# Patient Record
Sex: Female | Born: 1965 | Race: Black or African American | Hispanic: No | Smoking: Never smoker
Health system: Southern US, Community
[De-identification: ages and names within clinical notes are randomized; demographics above are authoritative.]

## PROBLEM LIST (undated history)

## (undated) DIAGNOSIS — Z9889 Other specified postprocedural states: Secondary | ICD-10-CM

## (undated) DIAGNOSIS — L309 Dermatitis, unspecified: Secondary | ICD-10-CM

## (undated) DIAGNOSIS — G43109 Migraine with aura, not intractable, without status migrainosus: Secondary | ICD-10-CM

## (undated) DIAGNOSIS — G932 Benign intracranial hypertension: Secondary | ICD-10-CM

## (undated) DIAGNOSIS — N62 Hypertrophy of breast: Secondary | ICD-10-CM

## (undated) DIAGNOSIS — K08409 Partial loss of teeth, unspecified cause, unspecified class: Secondary | ICD-10-CM

## (undated) DIAGNOSIS — Z98811 Dental restoration status: Secondary | ICD-10-CM

## (undated) HISTORY — DX: Benign intracranial hypertension: G93.2

## (undated) HISTORY — DX: Dermatitis, unspecified: L30.9

## (undated) HISTORY — PX: REDUCTION MAMMAPLASTY: SUR839

## (undated) HISTORY — DX: Other specified postprocedural states: Z98.890

## (undated) HISTORY — DX: Migraine with aura, not intractable, without status migrainosus: G43.109

---

## 2013-12-14 ENCOUNTER — Encounter: Payer: Self-pay | Admitting: Neurology

## 2013-12-17 ENCOUNTER — Ambulatory Visit (INDEPENDENT_AMBULATORY_CARE_PROVIDER_SITE_OTHER): Payer: 59 | Admitting: Neurology

## 2013-12-17 ENCOUNTER — Ambulatory Visit (INDEPENDENT_AMBULATORY_CARE_PROVIDER_SITE_OTHER): Payer: 59

## 2013-12-17 ENCOUNTER — Encounter: Payer: Self-pay | Admitting: Neurology

## 2013-12-17 ENCOUNTER — Encounter (INDEPENDENT_AMBULATORY_CARE_PROVIDER_SITE_OTHER): Payer: Self-pay

## 2013-12-17 VITALS — BP 116/72 | HR 85 | Ht 66.75 in | Wt 257.0 lb

## 2013-12-17 DIAGNOSIS — G5712 Meralgia paresthetica, left lower limb: Secondary | ICD-10-CM

## 2013-12-17 DIAGNOSIS — G5710 Meralgia paresthetica, unspecified lower limb: Secondary | ICD-10-CM | POA: Insufficient documentation

## 2013-12-17 DIAGNOSIS — G35 Multiple sclerosis: Secondary | ICD-10-CM

## 2013-12-17 MED ORDER — GABAPENTIN 100 MG PO CAPS: 100.0000 mg | ORAL_CAPSULE | Freq: Three times a day (TID) | ORAL | Status: DC

## 2013-12-17 NOTE — Procedures (Signed)
    History:  Sherri Alvarado is a 48 year old patient with a history of headache, abnormal MRI the brain. The patient reports some alteration in peripheral vision. The patient is being evaluated for the visual disturbance.  Description: The visual evoked response test was performed today using 32 x 32 check sizes. The absolute latencies for the N1 and the P100 wave forms were within normal limits bilaterally. The amplitudes for the P100 wave forms were also within normal limits bilaterally. The visual acuity was 20/30 OD and 20/30 OS corrected.  Impression:  The visual evoked response test above was within normal limits bilaterally. No evidence of conduction slowing was seen within the anterior visual pathways on either side on today's evaluation.

## 2013-12-17 NOTE — Patient Instructions (Signed)
Overall you are doing fairly well but I do want to suggest a few things today:   Remember to drink plenty of fluid, eat healthy meals and do not skip any meals. Try to eat protein with a every meal and eat a healthy snack such as fruit or nuts in between meals. Try to keep a regular sleep-wake schedule and try to exercise daily, particularly in the form of walking, 20-30 minutes a day, if you can.   Your leg symptoms are most consistent with a diagnosis of meralgia paresthetica. The best thing you can do for this is exercise frequently, eat a healthy diet, work on losing some weight  As far as your medications are concerned, I would like to suggest the following: 1)Please start gabapentin 100mg  three times a day 2)I would like you to have a test called a visual evoked response to check for MS. Please schedule this when you check out 3)I would like you to have a MRI of the brain with contrast and a MRI of the cervical spine to check for signs of MS  Follow up once your workup is completed. Please call us with any interim questions, concerns, problems, updates or refill requests.   Please also call us for any test results so we can go over those with you on the phone.  My clinical assistant and will answer any of your questions and relay your messages to me and also relay most of my messages to you.   Our phone number is [phone]. We also have an after hours call service for urgent matters and there is a physician on-call for urgent questions. For any emergencies you know to call 911 or go to the nearest emergency room

## 2013-12-17 NOTE — Progress Notes (Signed)
[facility]    Provider:  Dr Sherri Alvarado Referring Provider: Ardyth Man, MD Primary Care Physician:  [facility], Sherri Land, MD  CC:  Question of MS  HPI:  Sherri Alvarado is a 48 y.o. female here as a referral from Dr. Willaim Alvarado for MS evaluation  Patient reports history of sensory changes, pain, headaches, change in peripheral vision. Had MRI brain done which showed "several small scattered areas of increased flare signal w/in the white matter, not consistent with MS, although cannot be excluded". Since March 6 has noticed some numbness and aching in her left leg. Feels symptoms are getting worse. Notes symptoms are just located in the area of the lateral femoral cutaneous nerve on the left. Denies any radiating symptoms distally. No weakness. No symptoms in RLE or bilateral UE. Notes in the past year has gained around 15 pounds.   She reports her mother has MS. Notes increased stress/anxiety in her life. Lost both her mother and sister in the past year, currently undergoing a divorce. Has a stressful job.  Has been out of work recently and feels that has helped her symptoms.   Notes her headaches have increased, typically occuring 3-4 times a week. Notes as a pressure type pain in her occipital region, can last few minutes up to an hour or so. No nausea or emesis. Mild phonophobia. Recently saw an eye doctor for floaters who told her she has MS, unclear based on what findings, there was some question of decreased VF in left inferior quadrant. Notes her left eye is tender to touch.   Review of Systems: Out of a complete 14 system review, the patient complains of only the following symptoms, and all other reviewed systems are negative. + fatigue, eye pain, easy bruising, memory loss, headache, numbness, aching muscles  History   Social History  . Marital Status: Unknown    Spouse Name: N/A    Number of Children: N/A  . Years of Education: N/A   Occupational History  .  Not on file.   Social History Main Topics  . Smoking status: Never Smoker   . Smokeless tobacco: Never Used  . Alcohol Use: Yes     Comment: occ around holidays  . Drug Use: Yes    Special: Marijuana     Comment: occ  . Sexual Activity: Not on file   Other Topics Concern  . Not on file   Social History Narrative   Separated, 2 children   Right handed   3 yrs of college   1 cup 3 times a week    Family History  Problem Relation Age of Onset  . Multiple sclerosis Mother   . Heart murmur Mother   . Hypertension Mother   . Kidney disease Father   . Cancer - Other Father     throat    No past medical history on file.  Past Surgical History  Procedure Laterality Date  . None      Current Outpatient Prescriptions  Medication Sig Dispense Refill  . ALPRAZolam (XANAX) 1 MG tablet       . ibuprofen (ADVIL,MOTRIN) 800 MG tablet       . TRINESSA, 28, 0.18/0.215/0.25 MG-35 MCG tablet        No current facility-administered medications for this visit.    Allergies as of 12/17/2013  . (No Known Allergies)    Vitals: BP 116/72  Pulse 85  Ht 5' 6.75" (1.695 m)  Wt 257 lb (116.574 kg)  BMI 40.58 kg/m2 Last Weight:  Wt Readings from Last 1 Encounters:  12/17/13 257 lb (116.574 kg)   Last Height:   Ht Readings from Last 1 Encounters:  12/17/13 5' 6.75" (1.695 m)     Physical exam: Exam: Gen: NAD, conversant Eyes: anicteric sclerae, moist conjunctivae HENT: Atraumatic, oropharynx clear Neck: Trachea midline; supple,  Lungs: CTA, no wheezing, rales, rhonic                          CV: RRR, no MRG Abdomen: Soft, non-tender;  Extremities: No peripheral edema  Skin: Normal temperature, no rash,  Psych: Appropriate affect, pleasant  Neuro: MS: AA&Ox3, appropriately interactive, normal affect   Speech: fluent w/o paraphasic error  Memory: good recent and remote recall  CN: PERRL, unable to visualize optic discs due to pupil size, [facility] with possible  slight decrease in inferior lateral quadrant of OS, EOMI no nystagmus, no ptosis, sensation intact to LT V1-V3 bilat, face symmetric, no weakness, hearing grossly intact, palate elevates symmetrically, shoulder shrug 5/5 bilat,  tongue protrudes midline, no fasiculations noted.  Motor: normal bulk and tone Strength: 5/5  In all extremities  Coord: rapid alternating and point-to-point (FNF, HTS) movements intact.  Reflexes: symmetrical, bilat downgoing toes  Sens: decreased LT in distribution of lateral femoral cutaneous nerve of left upper extremity  Gait: posture, stance, stride and arm-swing normal. Tandem gait intact. Able to walk on heels and toes. Romberg absent.   Assessment:  After physical and neurologic examination, review of laboratory studies, imaging, neurophysiology testing and pre-existing records, assessment will be reviewed on the problem list.  Plan:  Treatment plan and additional workup will be reviewed under Problem List.  1)Meralgia paresthetica 2)Visual field cuts 3)Depression  47y/o woman presenting for initial evaluation of possible MS with abnormal brain MRI. MRI report not fully consistent with MS but images not available to be reviewed. Sensory changes in LLE is most consistent with a diagnosis of meralgia paresthetica, likely exacerbated by recent weight gain. Counseled patient on diagnosis, she expressed understanding. With visual changes, and family history of MS will complete MS workup, including MRI brain w/ contrast, MRI C spine and VER. If tests concerning for MS would consider LP. Start gabapentin 100mg  TID for symptomatic relief.    Sherri ChoPeter Abundio Teuscher, DO  [facility] [address] [place], [place] [id number]  Phone [phone] Fax [phone]

## 2013-12-26 ENCOUNTER — Ambulatory Visit (INDEPENDENT_AMBULATORY_CARE_PROVIDER_SITE_OTHER): Payer: 59

## 2013-12-26 DIAGNOSIS — G35 Multiple sclerosis: Secondary | ICD-10-CM

## 2013-12-26 MED ORDER — GADOPENTETATE DIMEGLUMINE 469.01 MG/ML IV SOLN: 20.0000 mL | Freq: Once | INTRAVENOUS | Status: AC | PRN

## 2014-01-01 ENCOUNTER — Telehealth: Payer: Self-pay | Admitting: Neurology

## 2014-01-01 NOTE — Telephone Encounter (Signed)
Pt calling requesting MRI results, Dr. Hosie Poisson out of the office, sending to [facility], Dr. Vickey Huger. Please advise

## 2014-01-01 NOTE — Telephone Encounter (Signed)
Calling for MRI results.

## 2014-01-01 NOTE — Telephone Encounter (Addendum)
I called pt after consulting Dr. Marjory Lies.  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  Pt verbalized understanding.  She states that gabapentin has helped her leg, but she continues with headache.  ? LP.   Released results to MyChart.  Wanted to fax results to [place] in [place].  I faxed to her pcp/referring MD.

## 2014-01-03 ENCOUNTER — Other Ambulatory Visit: Payer: Self-pay | Admitting: Neurology

## 2014-01-03 ENCOUNTER — Telehealth: Payer: Self-pay | Admitting: *Deleted

## 2014-01-03 DIAGNOSIS — M542 Cervicalgia: Secondary | ICD-10-CM

## 2014-01-03 NOTE — Telephone Encounter (Signed)
Pt wanted to know if she could continue taking gabapentin (NEURONTIN) 100 MG capsule.  Please call and advise.  Thanks

## 2014-01-03 NOTE — Progress Notes (Signed)
Quick Note:  Informed patient of normal MRI as requested, patient states she received a call stating that she received a call stating something else, checked phone notes and Alverda Skeans spoke with her, patient states that she still has some questions about what's next for treatment, and medication, she would like to talk to you at your earliest convenience. ______

## 2014-01-03 NOTE — Telephone Encounter (Signed)
Please let her know it is fine to continue taking the gabapentin. She will also follow up with physical therapy as instructed. Thanks.

## 2014-01-04 NOTE — Telephone Encounter (Signed)
I called pt and let her know to continue with gabapentin.  She was on the phone with PT when I called.  I made f/u appt 01-22-14 to go over test results.

## 2014-01-11 ENCOUNTER — Telehealth: Payer: Self-pay | Admitting: *Deleted

## 2014-01-14 NOTE — Telephone Encounter (Signed)
I called pt and let her know that [employer]  Is doing the forms.  She faxed last Wednesday.   Would like copy when she comes for appt on 01-22-14.  I would forward to Stanton KidneyDebra in MR.

## 2014-01-22 ENCOUNTER — Encounter: Payer: Self-pay | Admitting: Neurology

## 2014-01-22 ENCOUNTER — Encounter (INDEPENDENT_AMBULATORY_CARE_PROVIDER_SITE_OTHER): Payer: Self-pay

## 2014-01-22 ENCOUNTER — Ambulatory Visit (INDEPENDENT_AMBULATORY_CARE_PROVIDER_SITE_OTHER): Payer: 59 | Admitting: Neurology

## 2014-01-22 VITALS — BP 121/73 | HR 78 | Ht 66.0 in | Wt 251.0 lb

## 2014-01-22 DIAGNOSIS — R51 Headache: Secondary | ICD-10-CM

## 2014-01-22 DIAGNOSIS — R519 Headache, unspecified: Secondary | ICD-10-CM | POA: Insufficient documentation

## 2014-01-22 DIAGNOSIS — G5710 Meralgia paresthetica, unspecified lower limb: Secondary | ICD-10-CM

## 2014-01-22 DIAGNOSIS — H5340 Unspecified visual field defects: Secondary | ICD-10-CM

## 2014-01-22 DIAGNOSIS — Z0289 Encounter for other administrative examinations: Secondary | ICD-10-CM

## 2014-01-22 NOTE — Patient Instructions (Signed)
Overall you are doing fairly well but I do want to suggest a few things today:   Please follow up with your eye doctor and have your results faxed to [place]  As far as diagnostic testing:  1)I would like you to have a lumbar puncture done, you will be called to schedule this  Follow up once the lumbar puncture is completed. Please call us with any interim questions, concerns, problems, updates or refill requests.   My clinical assistant and will answer any of your questions and relay your messages to me and also relay most of my messages to you.   Our phone number is [phone]. We also have an after hours call service for urgent matters and there is a physician on-call for urgent questions. For any emergencies you know to call 911 or go to the nearest emergency room

## 2014-01-22 NOTE — Progress Notes (Signed)
[facility]    Provider:  Dr Hosie Poisson Referring Provider: Ardyth Man, MD Primary Care Physician:  Ardyth Man, MD  CC:  Question of MS  HPI:  Sherri Alvarado is a 48 y.o. female here as a follow up from Dr. Willaim Bane for MS evaluation. At last visit her symptoms were consistent with a diagnosis of meralgia paresthetica and she was started on Gabapentin 100mg  TID which has helped improve her LE symptoms. She continues to suffer from headache and visual deficits. Scheduled to see her eye doctor next week. She notes episodes consistent with transient obscurations. No other acute concerns at this time.    Initial visit 12/2013: Patient reports history of sensory changes, pain, headaches, change in peripheral vision. Had MRI brain done which showed "several small scattered areas of increased flare signal w/in the white matter, not consistent with MS, although cannot be excluded". Since March 6 has noticed some numbness and aching in her left leg. Feels symptoms are getting worse. Notes symptoms are just located in the area of the lateral femoral cutaneous nerve on the left. Denies any radiating symptoms distally. No weakness. No symptoms in RLE or bilateral UE. Notes in the past year has gained around 15 pounds.   She reports her mother has MS. Notes increased stress/anxiety in her life. Lost both her mother and sister in the past year, currently undergoing a divorce. Has a stressful job.  Has been out of work recently and feels that has helped her symptoms.   Notes her headaches have increased, typically occuring 3-4 times a week. Notes as a pressure type pain in her occipital region, can last few minutes up to an hour or so. No nausea or emesis. Mild phonophobia. Recently saw an eye doctor for floaters who told her she has MS, unclear based on what findings, there was some question of decreased VF in left inferior quadrant. Notes her left eye is tender to touch.   Review of  Systems: Out of a complete 14 system review, the patient complains of only the following symptoms, and all other reviewed systems are negative. + fatigue, eye pain, easy bruising, memory loss, headache, numbness, aching muscles  History   Social History  . Marital Status: Unknown    Spouse Name: N/A    Number of Children: N/A  . Years of Education: N/A   Occupational History  . Not on file.   Social History Main Topics  . Smoking status: Never Smoker   . Smokeless tobacco: Never Used  . Alcohol Use: Yes     Comment: occ around holidays  . Drug Use: Yes    Special: Marijuana     Comment: occ  . Sexual Activity: Not on file   Other Topics Concern  . Not on file   Social History Narrative   Separated, 2 children   Right handed   3 yrs of college   1 cup 3 times a week    Family History  Problem Relation Age of Onset  . Multiple sclerosis Mother   . Heart murmur Mother   . Hypertension Mother   . Kidney disease Father   . Cancer - Other Father     throat    No past medical history on file.  Past Surgical History  Procedure Laterality Date  . None      Current Outpatient Prescriptions  Medication Sig Dispense Refill  . ALPRAZolam (XANAX) 1 MG tablet       .  gabapentin (NEURONTIN) 100 MG capsule Take 1 capsule (100 mg total) by mouth 3 (three) times daily.  90 capsule  3  . TRINESSA, 28, 0.18/0.215/0.25 MG-35 MCG tablet       . ibuprofen (ADVIL,MOTRIN) 800 MG tablet        No current facility-administered medications for this visit.    Allergies as of 01/22/2014  . (No Known Allergies)    Vitals: BP 121/73  Pulse 78  Ht 5\' 6"  (1.676 m)  Wt 251 lb (113.853 kg)  BMI 40.53 kg/m2 Last Weight:  Wt Readings from Last 1 Encounters:  01/22/14 251 lb (113.853 kg)   Last Height:   Ht Readings from Last 1 Encounters:  01/22/14 5\' 6"  (1.676 m)     Physical exam: Exam: Gen: NAD, conversant Eyes: anicteric sclerae, moist conjunctivae HENT: Atraumatic,  oropharynx clear Neck: Trachea midline; supple,  Lungs: CTA, no wheezing, rales, rhonic                          CV: RRR, no MRG Abdomen: Soft, non-tender;  Extremities: No peripheral edema  Skin: Normal temperature, no rash,  Psych: Appropriate affect, pleasant  Neuro: MS: AA&Ox3, appropriately interactive, normal affect   Speech: fluent w/o paraphasic error  Memory: good recent and remote recall  CN: PERRL, unable to visualize optic discs due to pupil size, [facility] to [facility] with possible slight decrease in inferior lateral quadrant of OS, EOMI no nystagmus, no ptosis, sensation intact to LT V1-V3 bilat, face symmetric, no weakness, hearing grossly intact, palate elevates symmetrically, shoulder shrug 5/5 bilat,  tongue protrudes midline, no fasiculations noted.  Motor: normal bulk and tone Strength: 5/5  In all extremities  Coord: rapid alternating and point-to-point (FNF, HTS) movements intact.  Reflexes: symmetrical, bilat downgoing toes  Sens: decreased LT in distribution of lateral femoral cutaneous nerve of left upper extremity  Gait: posture, stance, stride and arm-swing normal. Tandem gait intact. Able to walk on heels and toes. Romberg absent.   Assessment:  After physical and neurologic examination, review of laboratory studies, imaging, neurophysiology testing and pre-existing records, assessment will be reviewed on the problem list.  Plan:  Treatment plan and additional workup will be reviewed under Problem List.  1)Meralgia paresthetica 2)headache 3)visual field cuts  47y/o woman presenting for follow up evaluation of symptoms consistent with a diagnosis of meralgia paresthetica which has responded well to gabapentin. Etiology of headache and visual field deficits is unclear. Have concern over possible IIH based on history of headache and visual deficits. Patient to follow up with eye doctor and will plan for lumbar puncture. Can consider starting topamax or diamox  pending results.    Sherri ChoPeter Lassie Demorest, DO  [facility] [address] [place], [place] [address]  Phone [phone] Fax [phone]

## 2014-01-23 ENCOUNTER — Ambulatory Visit (HOSPITAL_COMMUNITY)
Admission: RE | Admit: 2014-01-23 | Discharge: 2014-01-23 | Disposition: A | Discharge status: Home / Self Care | Payer: 59 | Source: Ambulatory Visit | Attending: Neurology | Admitting: Neurology

## 2014-01-23 DIAGNOSIS — M542 Cervicalgia: Secondary | ICD-10-CM | POA: Insufficient documentation

## 2014-01-23 DIAGNOSIS — IMO0001 Reserved for inherently not codable concepts without codable children: Secondary | ICD-10-CM | POA: Insufficient documentation

## 2014-01-23 DIAGNOSIS — R29898 Other symptoms and signs involving the musculoskeletal system: Secondary | ICD-10-CM | POA: Insufficient documentation

## 2014-01-23 DIAGNOSIS — R209 Unspecified disturbances of skin sensation: Secondary | ICD-10-CM | POA: Insufficient documentation

## 2014-01-23 DIAGNOSIS — R51 Headache: Secondary | ICD-10-CM | POA: Insufficient documentation

## 2014-01-23 NOTE — Evaluation (Signed)
Physical Therapy Evaluation  Patient Details  Name: Sherri Alvarado Hiser-Goins MRN: [id number] Date of Birth: 01-26-1966  Today'Alvarado Date: 01/23/2014 Time: 1015-1100 PT Time Calculation (min): 45 min    Charges: 1 Evaluation,  Manual 1043-1100           Visit#: 1 of 16  Re-eval: 02/22/14 Assessment Diagnosis: Limited cervical spine mobility secondary to pain, weakness  Next MD Visit: to  be determined Prior Therapy: no   Authorization: [employer]    Authorization Time Period:    Authorization Visit#:   of     Past Medical History: No past medical history on file. Past Surgical History:  Past Surgical History  Procedure Laterality Date  . None      Subjective Symptoms/Limitations Symptoms: head aches, numbness in Lt leg anterior hip to knee (intermittent),  Lt eye pain, weakness in Lt arm Pertinent History: Cervical pain due to possible disc pathology. Patient has had other symptoms including neck shoulder, low back, pain in Lt eye and head aches that may be due to another factor resulting in doctor to want to due a spinal tap to determine if the head aches,  eye pain, and neck pain are related. Patient has halready been screened for MS by doctor via MRi which was ruled out.  Patient works a Health and safety inspectordesk job  during [employer] she notes neck paina nd headaches. MRI: C2-3 disc bulge Pain Assessment Currently in Pain?: Yes Pain Score: 7  Pain Location: Neck Pain Orientation: Mid;Medial Pain Type: Chronic pain Pain Radiating Towards: Lt arm/leg/headache Pain Onset: More than a month ago Pain Frequency: Intermittent Pain Relieving Factors: correcting posture, sleeping on back  Effect of Pain on Daily Activities: difficulty turning neck to Lt and Rt, difficulty picking up things from ground, middle low back pain. pain with looking up vs down, pain/numbeness with Let side laying,   Precautions/Restrictions     Balance Screening Balance Screen Has the patient fallen in the past 6 months: Yes How many  times?: 1 Has the patient had a decrease in activity level because of a fear of falling? : No Is the patient reluctant to leave their home because of a fear of falling? : No  Sensation/Coordination/Flexibility/Functional Tests Functional Tests Functional Tests: Cervical spine joint mobility C1-C6 severe limited down glide ROM and reproduction of symptoms bilaterally, C1-C6 up glide mild mobility limitation, releif of pain Functional Tests: FOTO: 51% limitation, 49% function  Assessment Cervical AROM Cervical Flexion: 55 (pain with holding) Cervical Extension: 33 (end range pain) Cervical - Right Side Bend: 40 Cervical - Left Side Bend: 53 Cervical - Right Rotation: 53 Cervical - Left Rotation: 60 Cervical Strength Cervical Flexion: 3/5 Cervical Extension: 3/5 Palpation Palpation: Scalenes particularly tender.   Exercise/Treatments Stretches Other Neck Stretches: 3D neck stretch with shoulder distraction  Manual Therapy Manual Therapy: Myofascial release Joint Mobilization: Cervical distraction, bilateral C1-CD6 up and down glides, liitations noticed particularly at C5,6 Myofascial Release: Upper Trapezius, Levator, Scalenes, Subpccipital release  Physical Therapy Assessment and Plan PT Assessment and Plan Clinical Impression Statement: Patient demsontrates limited cervical spine mobility secondary to pain at end range of motion, increased muscle tension (Lavator, Upper Trapezius, paraspinals and Scalenes) and weak cervical muswcle stabilizers.   Pt will benefit from skilled therapeutic intervention in order to improve on the following deficits: Pain;Decreased activity tolerance;Decreased balance;Decreased range of motion;Increased muscle spasms;Increased fascial restricitons;Difficulty walking;Decreased strength;Impaired flexibility;Obesity Rehab Potential: Fair Clinical Impairments Affecting Rehab Potential: history of neck pain with thigh numbness, eye pain, and headaches  indicates other possible neurological factors PT Frequency: Min 2X/week PT Duration: 8 weeks PT Treatment/Interventions: Gait training;Functional mobility training;Therapeutic exercise;Manual techniques;Patient/family education PT Plan: Increase cervical spine mobility via stretches and manual techniques to improve joint and muscle mobility. Next session specifically address suboccipital and scalene muscle mmobilit limitations to improve symptoms.     Goals Home Exercise Program Pt/caregiver will Perform Home Exercise Program: For increased ROM PT Goal: Perform Home Exercise Program - Progress: Goal set today PT Short Term Goals Time to Complete Short Term Goals: 4 weeks PT Short Term Goal 1: patient will be able to extend cervical spine to 30 degrees with no pain.  PT Short Term Goal 2: patient will have head aches of <2 times daily PT Short Term Goal 3: patient deep neck flexor strength will demostrate improving endurance with ability to hold head off table in supine  in chin tuck position >20 seconds demosntratign improved ability to maintain good posture during work. PT Short Term Goal 4: Patient will be able to rotate head 60 degrees bilateral with no pain at end range PT Long Term Goals Time to Complete Long Term Goals: 8 weeks PT Long Term Goal 1: Patient will be pain free with all cervical spine AROM PT Long Term Goal 2: patient will expeience head aches of less than 3 times weekly Long Term Goal 3: patient deep neck flexor strength will demostrate improving endurance with ability to hold head off table in supine  in chin tuck position >60 seconds demosntratign improved ability to maintain good posture during work.  Problem List Patient Active Problem List   Diagnosis Date Noted  . Headache(784.0) 01/22/2014  . Meralgia paresthetica 12/17/2013    PT - End of Session Activity Tolerance: Patient tolerated treatment well General Behavior During Therapy: WFL for tasks  assessed/performed PT Plan of Care PT Home Exercise Plan: 3D cervical spine stretch 10x 3 seconds, 2x daily Consulted and Agree with Plan of Care: Patient  GP   Doyne Keel 01/23/2014, 12:20 PM  Physician Documentation Your signature is required to indicate approval of the treatment plan as stated above.  Please sign and either send electronically or make a copy of this report for your files and return this physician signed original.   Please mark one 1.__approve of plan  2. ___approve of plan with the following conditions.   ______________________________                                                          _____________________ Physician Signature                                                                                                             Date

## 2014-01-29 ENCOUNTER — Ambulatory Visit (HOSPITAL_COMMUNITY)
Admission: RE | Admit: 2014-01-29 | Discharge: 2014-01-29 | Disposition: A | Discharge status: Home / Self Care | Payer: 59 | Source: Ambulatory Visit | Attending: Family Medicine | Admitting: Family Medicine

## 2014-01-29 NOTE — Progress Notes (Signed)
Physical Therapy Treatment Patient Details  Name: Sherri Alvarado MRN: [id number] Date of Birth: 28-Apr-1966  Today's Date: 01/29/2014 Time: 0930-1015 PT Time Calculation (min): 45 min    Charges: Manual therapy [phone], TherEx 1000-1015 Visit#: 2 of 16  Re-eval: 02/22/14 Assessment Diagnosis: Limited cervical spine mobility secondary to pain, weakness  Next MD Visit: to  be determined Prior Therapy: no   Authorization: [employer]   Subjective: Symptoms/Limitations Symptoms: Patient states exercises have been going well and that she feels the difference, but continues to have pain. continues Lt arm numbness Pertinent History: Cervical pain due to possible disc pathology. Patient has had other symptoms including neck shoulder, low back, pain in Lt eye and head aches that may be due to another factor resulting in doctor to want to due a spinal tap to determine if the head aches,  eye pain, and neck pain are related. Patient has halready been screened for MS by doctor via [place] which was ruled out.  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 MRI: C2-3 disc bulge Pain Assessment Currently in Pain?: Yes Pain Score: 7  Pain Location: Neck Pain Orientation: Mid;Medial Pain Type: Chronic pain Pain Onset: More than a month ago Pain Frequency: Intermittent Pain Relieving Factors: correcting posture, sleeping on back Effect of Pain on Daily Activities: difficulty turning neck to Lt and Rt, difficulty picking up things from ground, middle low back pain. pain with looking up vs down, pain/numbeness with Let side laying  Precautions/Restrictions     Exercise/Treatments Stretches Levator Stretch: 5 reps;10 seconds Other Neck Stretches: 3D neck stretch with shoulder distraction 10x 3 seconds in sitting and standing Other Neck Stretches: 3 way Pec stretch 10x 3 Sec    Posterior shoulder stretch 10x 3seconds Manual Therapy Joint Mobilization: Cervical  distraction, Lt C1-CD6 upglides, limitations noticed particularly at C1,2,5,6 Myofascial Release: Upper Trapezius, Levator, Scalenes, Subpccipital release, Pec Major/minor release  Physical Therapy Assessment and Plan PT Assessment and Plan Clinical Impression Statement: Patient demonstrates limited cervical spine mobility secondary to pain at end range of motion, increased muscle tension (Lavator, Upper Trapezius, paraspinals and Scalenes) and weak cervical muscle stabilizers. Specifically patient has limited joint mobility with cervical up-glides throughout Lt Cervical spine, though performance of said glides decreased patient's pain, numbness and tingling. Patient also displayed limited pectoral mobility that when compressed resulted in patient's Lt arm pain, following Pectoral release patient noted decreased UE symptoms.  PT Plan: Increase cervical spine mobility via stretches and manual techniques to improve joint and muscle mobility. Next session specifically address suboccipital and scalene muscle mmobilit limitations to improve symptoms.     Problem List Patient Active Problem List   Diagnosis Date Noted  . Headache(784.0) 01/22/2014  . Meralgia paresthetica 12/17/2013    PT - End of Session Activity Tolerance: Patient tolerated treatment well General Behavior During Therapy: WFL for tasks assessed/performed PT Plan of Care PT Home Exercise Plan: 3D cervical spine stretch 10x 3 seconds, 2x daily Peck and posteriro shoudler stretch  GP    Gwendelyn Lanting R Lerline Valdivia 01/29/2014, 8:02 PM

## 2014-01-31 ENCOUNTER — Ambulatory Visit (HOSPITAL_COMMUNITY): Payer: 59 | Admitting: Physical Therapy

## 2014-02-06 ENCOUNTER — Inpatient Hospital Stay (HOSPITAL_COMMUNITY): Admission: RE | Admit: 2014-02-06 | Payer: 59 | Source: Ambulatory Visit

## 2014-02-07 ENCOUNTER — Inpatient Hospital Stay: Admission: RE | Admit: 2014-02-07 | Payer: 59 | Source: Ambulatory Visit

## 2014-02-08 ENCOUNTER — Ambulatory Visit (HOSPITAL_COMMUNITY): Payer: 59 | Admitting: Physical Therapy

## 2014-02-12 ENCOUNTER — Ambulatory Visit (HOSPITAL_COMMUNITY): Payer: 59 | Admitting: Physical Therapy

## 2014-02-14 ENCOUNTER — Ambulatory Visit (HOSPITAL_COMMUNITY): Payer: 59 | Admitting: Physical Therapy

## 2014-02-15 ENCOUNTER — Ambulatory Visit
Admission: RE | Admit: 2014-02-15 | Discharge: 2014-02-15 | Disposition: A | Discharge status: Home / Self Care | Payer: 59 | Source: Ambulatory Visit | Attending: Neurology | Admitting: Neurology

## 2014-02-15 DIAGNOSIS — H5340 Unspecified visual field defects: Secondary | ICD-10-CM

## 2014-02-15 DIAGNOSIS — G5710 Meralgia paresthetica, unspecified lower limb: Secondary | ICD-10-CM

## 2014-02-15 DIAGNOSIS — R51 Headache: Secondary | ICD-10-CM

## 2014-02-15 LAB — CSF CELL COUNT WITH DIFFERENTIAL
RBC Count, CSF: 0 cu mm
Tube #: 2
WBC CSF: 5 uL (ref 0–5)

## 2014-02-15 LAB — PROTEIN, CSF: TOTAL PROTEIN, CSF: 37 mg/dL (ref 15–45)

## 2014-02-15 MED ORDER — DIAZEPAM 5 MG PO TABS
10.0000 mg | ORAL_TABLET | Freq: Once | ORAL | Status: AC
  Administered 2014-02-15: 10 mg via ORAL

## 2014-02-15 NOTE — Discharge Instructions (Signed)

## 2014-02-18 LAB — CSF CULTURE
GRAM STAIN: NONE SEEN
ORGANISM ID, BACTERIA: NO GROWTH

## 2014-02-18 LAB — CSF CULTURE W GRAM STAIN: Gram Stain: NONE SEEN

## 2014-02-19 ENCOUNTER — Ambulatory Visit (HOSPITAL_COMMUNITY)
Admission: RE | Admit: 2014-02-19 | Discharge: 2014-02-19 | Disposition: A | Discharge status: Home / Self Care | Payer: 59 | Source: Ambulatory Visit | Attending: Neurology | Admitting: Neurology

## 2014-02-19 ENCOUNTER — Telehealth: Payer: Self-pay | Admitting: Neurology

## 2014-02-19 DIAGNOSIS — R29898 Other symptoms and signs involving the musculoskeletal system: Secondary | ICD-10-CM | POA: Insufficient documentation

## 2014-02-19 DIAGNOSIS — IMO0001 Reserved for inherently not codable concepts without codable children: Secondary | ICD-10-CM | POA: Insufficient documentation

## 2014-02-19 DIAGNOSIS — M542 Cervicalgia: Secondary | ICD-10-CM | POA: Insufficient documentation

## 2014-02-19 DIAGNOSIS — R209 Unspecified disturbances of skin sensation: Secondary | ICD-10-CM | POA: Insufficient documentation

## 2014-02-19 DIAGNOSIS — R51 Headache: Secondary | ICD-10-CM | POA: Insufficient documentation

## 2014-02-19 NOTE — Telephone Encounter (Signed)
Informed patient that results not reviewed by physician as yet, will call back once reviewed

## 2014-02-19 NOTE — Progress Notes (Signed)
Physical Therapy Treatment Patient Details  Name: Sherri Alvarado MRN: [id number] Date of Birth: December 13, 1965  Today's Date: 02/19/2014 Time: 1030-1100 PT Time Calculation (min): 30 min    Charges: TherEx 1030-1045, Manual 1045-1100 Visit#: 3 of 16  Re-eval: 02/22/14   Authorization: [employer]   Subjective: Symptoms/Limitations Symptoms: Patient states that she contineus to have pain with headaches and occassional Lt arm numbness following jogging. Patient said she had been feeling really well which made her want to try joging again.   Exercise/Treatments Stretches Upper Trapezius Stretch: 5 reps;10 seconds Levator Stretch: 5 reps;10 seconds Other Neck Stretches: 3D neck stretch with shoulder distraction 10x 3 seconds in sitting and standing Other Neck Stretches: 3 way Pec stretch 10x 3 Sec Standing Exercises Neck Retraction: 10 reps Seated Exercises W Back: 10 reps  Physical Therapy Assessment and Plan PT Assessment and Plan Clinical Impression Statement: Patient continues to demonstrate limited cervical spine mobility secondary to pain at end range of motion, increased muscle tension (Lavator, Upper Trapezius, paraspinals and Scalenes) and weak cervical muscle stabilizers. Specifically patient has limited joint mobility with cervical up-glides throughout Lt Cervical spine, though performance of said glides decreased patient's pain, numbness and tingling. Patient also displayed limited pectoral mobility that when compressed resulted in patient's Lt arm pain, following pectoral release patient noted decreased UE symptoms. Patient also noted improved headache following upper trapezius release. Patient's symptoms improved following strengthening exercises.  PT Plan: Increase cervical spine mobility via stretches and manual techniques to improve joint and muscle mobility. Next session specifically address suboccipital and scalene muscle mmobility limitations to improve symptoms and  progress deep neck flexor strengtheing as tolerated    Goals PT Short Term Goals PT Short Term Goal 1: patient will be able to extend cervical spine to 30 degrees with no pain.  PT Short Term Goal 2: patient will have head aches of <2 times daily PT Short Term Goal 2 - Progress: Progressing toward goal PT Short Term Goal 3: patient deep neck flexor strength will demostrate improving endurance with ability to hold head off table in supine  in chin tuck position >20 seconds demosntratign improved ability to maintain good posture during work. PT Short Term Goal 3 - Progress: Progressing toward goal PT Short Term Goal 4: Patient will be able to rotate head 60 degrees bilateral with no pain at end range PT Short Term Goal 4 - Progress: Progressing toward goal PT Long Term Goals PT Long Term Goal 1: Patient will be pain free with all cervical spine AROM PT Long Term Goal 1 - Progress: Progressing toward goal PT Long Term Goal 2: patient will expeience head aches of less than 3 times weekly PT Long Term Goal 2 - Progress: Progressing toward goal Long Term Goal 3: patient deep neck flexor strength will demostrate improving endurance with ability to hold head off table in supine  in chin tuck position >60 seconds demosntratign improved ability to maintain good posture during work. Long Term Goal 3 Progress: Progressing toward goal  Problem List Patient Active Problem List   Diagnosis Date Noted  . Headache(784.0) 01/22/2014  . Meralgia paresthetica 12/17/2013    PT - End of Session Activity Tolerance: Patient tolerated treatment well General Behavior During Therapy: WFL for tasks assessed/performed PT Plan of Care PT Home Exercise Plan: 3D cervical spine stretch 10x 3 seconds and 2x daily [employer]   GP    Tomicka Lover R Osmel Dykstra 02/19/2014, 11:01 AM

## 2014-02-19 NOTE — Telephone Encounter (Signed)
Patient calling for Spinal tap results.  Please return call [phone].

## 2014-02-19 NOTE — Telephone Encounter (Signed)
Returned call, no answer. Message left. Will try again at later date

## 2014-02-20 ENCOUNTER — Other Ambulatory Visit: Payer: Self-pay | Admitting: Neurology

## 2014-02-20 MED ORDER — TOPIRAMATE 25 MG PO TABS: 25.0000 mg | ORAL_TABLET | Freq: Two times a day (BID) | ORAL | Status: DC

## 2014-02-20 NOTE — Telephone Encounter (Signed)
Patient returning phone call regarding results

## 2014-02-20 NOTE — Telephone Encounter (Signed)
Returned call. Will start patient on Topamax 25mg  BID.

## 2014-02-22 ENCOUNTER — Inpatient Hospital Stay (HOSPITAL_COMMUNITY): Admission: RE | Admit: 2014-02-22 | Payer: 59 | Source: Ambulatory Visit | Admitting: Physical Therapy

## 2014-02-22 ENCOUNTER — Ambulatory Visit (HOSPITAL_COMMUNITY): Payer: 59 | Admitting: Physical Therapy

## 2014-02-26 ENCOUNTER — Ambulatory Visit (HOSPITAL_COMMUNITY): Payer: 59 | Admitting: Physical Therapy

## 2014-02-26 ENCOUNTER — Ambulatory Visit (HOSPITAL_COMMUNITY)
Admission: RE | Admit: 2014-02-26 | Discharge: 2014-02-26 | Disposition: A | Discharge status: Home / Self Care | Payer: 59 | Source: Ambulatory Visit | Attending: Family Medicine | Admitting: Family Medicine

## 2014-02-26 NOTE — Evaluation (Addendum)
Physical Therapy Evaluation  Patient Details  Name: Sherri Alvarado MRN: [id number] Date of Birth: [birth year]  Today's Date: 02/26/2014 Time: 1020-1100 PT Time Calculation (min): 40 min    Charges: manual therapy 1020-1050, TherEx 1050-1100          Visit#: 5 of 16  Re-eval: 03/28/14   Authorization: U[employer]    Authorization Visit#:  5 of   16  Past Medical History: No past medical history on file. Past Surgical History:  Past Surgical History  Procedure Laterality Date  . None     Subjective Symptoms/Limitations Symptoms: Patient recently has had no head aches for the last 4 days, contineuds to have eye blurriness and neck pain. 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 Cervical pain due to possible disc pathology. Patient has had other symptoms including neck shoulder, low back, pain in Lt eye and head aches that may be due to another factor resulting in doctor to want to due a spinal tap to determine if the head aches,  eye pain, and neck pain are related. Patient has halready been screened for MS by doctor via [place] which was ruled out.  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 MRI: C2-3 disc bulge Pain Assessment Currently in Pain?: Yes Pain Score: 5   Assessment Cervical AROM Cervical Flexion: 55 (pain with holding) Cervical Extension: 43 Cervical - Right Side Bend: 40 Cervical - Left Side Bend: 43 Cervical - Right Rotation: 64 Cervical - Left Rotation: 65 Cervical Strength Cervical Flexion: 3/5 Cervical Extension: 3/5 Palpation Palpation: tenderness in Uppertrapezius  Exercise/Treatments Stretches Upper Trapezius Stretch: 5 reps;10 seconds Levator Stretch: 5 reps;10 seconds Other Neck Stretches: 3D neck stretch with shoulder  distraction 10x 3 seconds in sitting and standing Other Neck Stretches: 3 way Pec stretch 10x 3 Sec  Manual Therapy Joint Mobilization: Cervical distraction, Lt C1-C6 upglides, limitations noticed particularly at C1,2,5,6 Myofascial Release: Upper Trapezius, Levator, Scalenes, Subpccipital release, Pec Major/minor release 70mnutes  Physical Therapy Assessment and Plan PT Assessment and Plan Clinical Impression Statement: Patient demonstrates limited though significantly improved cervical spine AROM with improved muscle tension in all cervical spine muscles though patient continues to have limited upper trapezius mobility.  Specifically patient has limited joint mobility with cervical up-glides throughout Lt Cervical spine, though performance of said glides decreased patient's pain, numbness and tingling. Patient also displayed limited pectoral mobility that when compressed resulted in patient's Lt arm pain, following pectoral release patient noted decreased UE symptoms. Patient will continue to benefit from skilled physical therapy to address limited ROM an UE and postural muscle weakness. Patient's slow progress thus far is attributed to limited exercise adherence and difficulty attending all physical therapy session for which patient states this session, without cuing, that she well be  more astute in her performance of HEP and attending physical therapy sessions.  PT Plan: Continue phsyciaql therapy 2x a week for 4 more weeks with focus on progressing ROM and stability/strength of UE and cervical spine muscles. introduce cervical spine matrix next session    PT Short Term Goals PT Short Term Goal 1: patient will be able to extend cervical spine to 30 degrees with no pain.  PT Short Term Goal 1 - Progress: Met PT Short Term Goal 2: patient will have head aches of <  2 times daily PT Short Term Goal 2 - Progress: Met PT Short Term Goal 3: patient deep neck flexor strength will demostrate improving  endurance with ability to hold head off table in supine  in chin tuck position >20 seconds demosntratign improved ability to maintain good posture during work. PT Short Term Goal 3 - Progress: Progressing toward goal PT Short Term Goal 4: Patient will be able to rotate head 60 degrees bilateral with no pain at end range PT Short Term Goal 4 - Progress: Met PT Long Term Goals PT Long Term Goal 1: Patient will be pain free with all cervical spine AROM PT Long Term Goal 1 - Progress: Progressing toward goal PT Long Term Goal 2: patient will expeience head aches of less than 3 times weekly PT Long Term Goal 2 - Progress: Progressing toward goal Long Term Goal 3: patient deep neck flexor strength will demostrate improving endurance with ability to hold head off table in supine  in chin tuck position >60 seconds demosntratign improved ability to maintain good posture during work. Long Term Goal 3 Progress: Progressing toward goal Problem List Patient Active Problem List   Diagnosis Date Noted  . Headache(784.0) 01/22/2014  . Meralgia paresthetica 12/17/2013       GP    DeWitt, Cash R PT DPT 02/26/2014, 11:26 AM  Physician Documentation Your signature is required to indicate approval of the treatment plan as stated above.  Please sign and either send electronically or make a copy of this report for your files and return this physician signed original.   Please mark one 1.__approve of plan  2. ___approve of plan with the following conditions.   ______________________________                                                          _____________________ Physician Signature                                                                                                             Date

## 2014-02-27 ENCOUNTER — Telehealth: Payer: Self-pay | Admitting: Neurology

## 2014-02-27 NOTE — Telephone Encounter (Signed)
Pt called to request a refill prescription for ALPRAZolam Prudy Feeler) 1 MG tablet for her severe headache and pt also wants to know is she to continue taking her gabapentin (NEURONTIN) 100 MG capsule? Pt states the topiramate (TOPAMAX) 25 MG tablet is helping some but wanted to make sure if she is continue on the Gabapentin? Please call pt when this has been called in and also advised of the continuation of the Gabapentin. Thanks

## 2014-02-27 NOTE — Telephone Encounter (Signed)
She can continue on topamax and gabapentin for now. I did not prescribe the xanax and it is not a typical headache medication. If she wants a refill she will need to follow up with the prescribing physician. Thanks.

## 2014-02-27 NOTE — Telephone Encounter (Signed)
I called the patient back.  Got no answer.  Left message. 

## 2014-02-27 NOTE — Telephone Encounter (Signed)
I called back.  The patient is requesting we prescribe Xanax for "severe" headaches.  As well, she notes Topamax is giving some benefit.  She would like to know if she should continue taking Gabapentin as well, or only Topamax.  Please advise.  Thank you.

## 2014-02-28 ENCOUNTER — Ambulatory Visit (HOSPITAL_COMMUNITY): Payer: 59

## 2014-02-28 ENCOUNTER — Telehealth: Payer: Self-pay | Admitting: Neurology

## 2014-02-28 ENCOUNTER — Ambulatory Visit (HOSPITAL_COMMUNITY)
Admission: RE | Admit: 2014-02-28 | Discharge: 2014-02-28 | Disposition: A | Discharge status: Home / Self Care | Payer: 59 | Source: Ambulatory Visit | Attending: Family Medicine | Admitting: Family Medicine

## 2014-02-28 ENCOUNTER — Ambulatory Visit (HOSPITAL_COMMUNITY): Payer: 59 | Admitting: Physical Therapy

## 2014-02-28 NOTE — Telephone Encounter (Signed)
Pt called has been looking on the internet concerns with all her medication and the mixture. Pt states she is taking Trinessa 28 and uses Fluonase for her sinuses, wants to know if it is ok to use these products with the medication gabapentin (NEURONTIN) 100 MG capsule & topiramate (TOPAMAX) 25 MG tablet. Pt states per internet site with her condition meralgia paresthetica there would be problems due to the birth control. Also pt would like to know if she does get another headache what is something that she can take? Please call pt concerning these matters. Thanks

## 2014-02-28 NOTE — Progress Notes (Signed)
Physical Therapy Treatment Patient Details  Name: Sherri Alvarado MRN: [id number] Date of Birth: Nov 18, 1965  Today's Date: 02/28/2014 Time: 1020-1058 PT Time Calculation (min): 38 min Charge TE 1020-1040, Manual 1040-1058  Visit#: 6 of 16  Re-eval: 03/28/14 Assessment Diagnosis: Limited cervical spine mobility secondary to pain, weakness  Next MD Visit: to  be determined Prior Therapy: no   Authorization: [employer]  Authorization Time Period:    Authorization Visit#:   of     Subjective: Symptoms/Limitations Symptoms: Pt stated neck is feeling better, pt complete  Objective:   Exercise/Treatments Stretches Upper Trapezius Stretch: 3 reps;30 seconds Other Neck Stretches: 3D neck stretch with shoulder distraction 10x 3 seconds in sitting and standing Other Neck Stretches: 3 way Pec stretch 10x 3 Sec Standing Exercises Other Standing Exercises: Cervical matrix 10x  Manual Therapy Manual Therapy: Myofascial release Myofascial Release: Upper Trapezius, Levator, Scalenes, Subpccipital release, gentle manual traction, Pec Major/minor release  Physical Therapy Assessment and Plan PT Assessment and Plan Clinical Impression Statement: Pt progressing well with cervical ROM, added cervical matrix to improve AROM.  Pt c/o headaches with new exercises and upper trapezius stretch.  Manual techniques complete to reduce fascial restrictions in upper trapezius and cervical musculature to reduce tension and pain.  Pt stated pain free at end of session with headache resolved.  Pt encouraged to stay hydrated to reduce risk of reoccuring headache. PT Plan: Continue PT POC to improve cervical ROM and improve stabilty and strength of UE and cervical spine musculature. Continue matrix and 3D excursion.      Goals PT Short Term Goals PT Short Term Goal 1: patient will be able to extend cervical spine to 30 degrees with no pain.  PT Short Term Goal 2: patient will have head aches of <2 times  daily PT Short Term Goal 3: patient deep neck flexor strength will demostrate improving endurance with ability to hold head off table in supine  in chin tuck position >20 seconds demosntratign improved ability to maintain good posture during work. PT Short Term Goal 3 - Progress: Progressing toward goal PT Short Term Goal 4: Patient will be able to rotate head 60 degrees bilateral with no pain at end range PT Long Term Goals PT Long Term Goal 1: Patient will be pain free with all cervical spine AROM PT Long Term Goal 1 - Progress: Progressing toward goal PT Long Term Goal 2: patient will expeience head aches of less than 3 times weekly PT Long Term Goal 2 - Progress: Progressing toward goal Long Term Goal 3: patient deep neck flexor strength will demostrate improving endurance with ability to hold head off table in supine  in chin tuck position >60 seconds demosntratign improved ability to maintain good posture during work.  Problem List Patient Active Problem List   Diagnosis Date Noted  . Headache(784.0) 01/22/2014  . Meralgia paresthetica 12/17/2013    PT - End of Session Activity Tolerance: Patient tolerated treatment well General Behavior During Therapy: [facility] for tasks assessed/performed  GP    Juel Burrow 02/28/2014, 1:58 PM

## 2014-02-28 NOTE — Telephone Encounter (Signed)
I called the patient back.  Got no answer. Left message recommending she consult the pharmacist where she gets her Rx's and they would be able to advise her if there are any concerns within her full list of medications prescribed.

## 2014-03-01 ENCOUNTER — Telehealth: Payer: Self-pay | Admitting: Neurology

## 2014-03-01 NOTE — Telephone Encounter (Signed)
I am ok with her being on the Trinessa. It should not be contributing to her meralgia paresthetica. Thanks

## 2014-03-01 NOTE — Telephone Encounter (Signed)
Patient calling back, a little confused about message left by Shanda Bumps.  She's concerned that birth control being linked as an contributing factor of Meralgia Parasthetica.  Questioning if Dr. Hosie Poisson recommends she discontinue her birth control.  Please call and advise.

## 2014-03-01 NOTE — Telephone Encounter (Signed)
Spoke with patient and she would like Dr Minus BreedingSumner's opinion on whether she should discontinue the Trinessa-28 birthcontrol since she has Meralgia Parasthetica. She does not want to contact the pharmacist as suggested by Jessica(see phone from 06/18)

## 2014-03-04 NOTE — Telephone Encounter (Signed)
Informed patient per Dr Minus Breeding note, she verbalized understanding

## 2014-03-05 ENCOUNTER — Ambulatory Visit (HOSPITAL_COMMUNITY): Payer: 59 | Admitting: Physical Therapy

## 2014-03-05 ENCOUNTER — Ambulatory Visit (HOSPITAL_COMMUNITY)
Admission: RE | Admit: 2014-03-05 | Discharge: 2014-03-05 | Disposition: A | Discharge status: Home / Self Care | Payer: 59 | Source: Ambulatory Visit | Attending: Family Medicine | Admitting: Family Medicine

## 2014-03-05 NOTE — Progress Notes (Signed)
Physical Therapy Treatment Patient Details  Name: Sherri Alvarado MRN: [id number] Date of Birth: 05/21/1966  Today's Date: 03/05/2014 Time: 1020-1100 PT Time Calculation (min): 40 min   Charges: Manual 1020-1040 TherEx 1040-1100 Visit#: 7 of 16  Re-eval: 03/28/14    Authorization: [employer]  Authorization Time Period:    Authorization Visit#: 7 of 16   Subjective: Symptoms/Limitations Symptoms: Pt stated neck is feeling better but notes mide to upper thoracic pain this session and Rt UE numbness and tingling followign Rt Pec stretch  Precautions/Restrictions     Exercise/Treatments Stretches Other Neck Stretches: 3D neck stretch with shoulder distraction 10x 3 seconds in sitting and standing Other Neck Stretches: 3 way Pec stretch 10x 3 Sec Theraband Exercises Rows: 20 reps;Blue (10 unilateral 10 bilateral. ) Standing Exercises Neck Retraction: 10 reps Other Standing Exercises: Cervical matrix 10x  Manual Therapy Myofascial Release: scalenes and pectoral muscles to relieve Rt UE numbness and tingling.   Physical Therapy Assessment and Plan PT Assessment and Plan Clinical Impression Statement: Pt progressing well with increased cervical ROM and decreased pain. Patient noted continued Rt UE numbness with Ue movements. Pt still c/o headaches with strenuous activities.  Manual techniques complete to reduce fascial restrictions in scalenes, upper trapezius and cervical musculature to reduce tension and pain, following which patient noted no UE numbness. Pt stated pain free at end of session with headache resolved.  PT Plan: Progress cervical AROM and stability as toelrated. continyue cervical spine matrix and introduce 4way bick up and reach next session if tolerated.     Goals PT Short Term Goals PT Short Term Goal 1: patient will be able to extend cervical spine to 30 degrees with no pain.  PT Short Term Goal 1 - Progress: Met PT Short Term Goal 2: patient will have head  aches of <2 times daily PT Short Term Goal 2 - Progress: Met PT Short Term Goal 3: patient deep neck flexor strength will demostrate improving endurance with ability to hold head off table in supine  in chin tuck position >20 seconds demosntratign improved ability to maintain good posture during work. PT Short Term Goal 3 - Progress: Progressing toward goal PT Short Term Goal 4: Patient will be able to rotate head 60 degrees bilateral with no pain at end range PT Short Term Goal 4 - Progress: Met PT Long Term Goals PT Long Term Goal 1: Patient will be pain free with all cervical spine AROM PT Long Term Goal 1 - Progress: Met PT Long Term Goal 2: patient will expeience head aches of less than 3 times weekly PT Long Term Goal 2 - Progress: Progressing toward goal Long Term Goal 3: patient deep neck flexor strength will demostrate improving endurance with ability to hold head off table in supine  in chin tuck position >60 seconds demosntratign improved ability to maintain good posture during work. Long Term Goal 3 Progress: Progressing toward goal  Problem List Patient Active Problem List   Diagnosis Date Noted  . Headache(784.0) 01/22/2014  . Meralgia paresthetica 12/17/2013    PT - End of Session Activity Tolerance: Patient tolerated treatment well General Behavior During Therapy: WFL for tasks assessed/performed  GP    DeWitt, Cash R 03/05/2014, 11:14 AM  

## 2014-03-07 ENCOUNTER — Ambulatory Visit (HOSPITAL_COMMUNITY): Payer: 59 | Admitting: Physical Therapy

## 2014-03-12 ENCOUNTER — Inpatient Hospital Stay (HOSPITAL_COMMUNITY): Admission: RE | Admit: 2014-03-12 | Payer: 59 | Source: Ambulatory Visit | Admitting: Physical Therapy

## 2014-03-14 ENCOUNTER — Telehealth: Payer: Self-pay | Admitting: Neurology

## 2014-03-14 ENCOUNTER — Ambulatory Visit: Payer: 59 | Admitting: Neurology

## 2014-03-14 NOTE — Telephone Encounter (Signed)
Pt calling stating that she just wanted to let Dr. Hosie Poisson know that she is doing much better while on the medications Gabapentin and Topiramate. Pt stated that she missed her appt on today and scheduled another appt for 04/22/14. Pt wanted to know if Dr. Hosie Poisson wanted her to continue with the topiramate and do the doctor wants pt to also have another LP or wait until her next OV. Please advise

## 2014-03-14 NOTE — Telephone Encounter (Signed)
Patient wanted to inform Dr. Hosie Poisson headaches and numbness are better when taking medication.  Some days eyesight is different, but has an appointment scheduled with Opthamologist.  Wanting to know what would be next step.  Will she continue with getting medication refilled.  Please call and advise.

## 2014-03-18 ENCOUNTER — Inpatient Hospital Stay (HOSPITAL_COMMUNITY): Admission: RE | Admit: 2014-03-18 | Payer: 59 | Source: Ambulatory Visit

## 2014-03-20 ENCOUNTER — Ambulatory Visit (HOSPITAL_COMMUNITY)
Admission: RE | Admit: 2014-03-20 | Payer: 59 | Source: Ambulatory Visit | Attending: Family Medicine | Admitting: Family Medicine

## 2014-04-01 ENCOUNTER — Ambulatory Visit (HOSPITAL_COMMUNITY)
Admission: RE | Admit: 2014-04-01 | Discharge: 2014-04-01 | Disposition: A | Discharge status: Home / Self Care | Payer: 59 | Source: Ambulatory Visit | Attending: Neurology | Admitting: Neurology

## 2014-04-01 DIAGNOSIS — IMO0001 Reserved for inherently not codable concepts without codable children: Secondary | ICD-10-CM | POA: Diagnosis not present

## 2014-04-01 DIAGNOSIS — R51 Headache: Secondary | ICD-10-CM | POA: Insufficient documentation

## 2014-04-01 DIAGNOSIS — M542 Cervicalgia: Secondary | ICD-10-CM | POA: Insufficient documentation

## 2014-04-01 DIAGNOSIS — R29898 Other symptoms and signs involving the musculoskeletal system: Secondary | ICD-10-CM | POA: Insufficient documentation

## 2014-04-01 DIAGNOSIS — R209 Unspecified disturbances of skin sensation: Secondary | ICD-10-CM | POA: Diagnosis not present

## 2014-04-01 NOTE — Progress Notes (Signed)
Physical Therapy Re-evaluation/Treatment  Patient Details  Name: Sherri Alvarado MRN: [id number] Date of Birth: 1965-12-16  Today's Date: 04/01/2014 Time: 1030-1105 PT Time Calculation (min): 35 min Charge: MMT/ROM 1030-1040, Manual 1045-1105               Visit#: 8 of 12  Re-eval: 04/29/14 Assessment Diagnosis: Limited cervical spine mobility secondary to pain, weakness  Next MD Visit: Summer August 2015 Prior Therapy: no   Authorization: [employer]    Authorization Time Period:    Authorization Visit#: 8 of 12   Subjective Symptoms/Limitations Symptoms: Pt stated neck is feeling much better.   Pain Assessment Currently in Pain?: Yes Pain Score: 5  Pain Location: Neck Pain Orientation: Left  Objective:   Sensation/Coordination/Flexibility/Functional Tests Functional Tests Functional Tests: FOTO: Currently 50/50 was 50% limitation, 50% function  Assessment Cervical AROM Cervical Flexion: 55 Cervical Extension: 45 (was 43) Cervical - Right Side Bend: 42 (was 40) Cervical - Left Side Bend: 45 (was 43) Cervical - Right Rotation: 70 (was 64) Cervical - Left Rotation: 70 (was 65) Cervical Strength Cervical Flexion: 4/5 (was 3/5) Cervical Extension: 5/5 (was 3/5) Palpation Palpation: tenderness in Uppertrapezius  Exercise/Treatments Stretches Upper Trapezius Stretch: 3 reps;30 seconds Levator Stretch: 3 reps;20 seconds Other Neck Stretches: 3D neck stretch with shoulder distraction 10x 3 seconds in sitting and standing Supine Exercises Other Supine Exercise: Deep neck flexor lift 3 reps with 20" max  Manual Therapy Manual Therapy: Massage Massage: Upper traps, scalenes, splenius capitis, levator scapul, massater, temporal lobs, pectoralis major gentle manual traction.  Physical Therapy Assessment and Plan PT Assessment and Plan Clinical Impression Statement: Reassessment complete with the following findings:  Pt able to demonstrate appropriate form and  technqiues with stretches and exercises, reports she has not been as compliant with HEP lately since school has been out with 3 kids.  Pt reports interest in breast reduction for pain control, pt encouraged to talk to MD.  Cervical ROM WFL wtih c/o pain end range Rt and Lt with visible upper trapezius tightness.  Neck strength is improving, pt able to hold for 20" deep neck flexor prior limited by fatigue.  Pt reoprts headached have decreased in frequency to once every 2 weeks.  Recommend continuing OPPT for 1x week for 4 more weeks to improve strength, muscle lengthening with streches and manual technqiues to reduce spasms and headaches.   PT Plan: Recommend continuiong OPPTfor 4 more weeks x1 time a week for pt's schedule.     Goals Home Exercise Program PT Goal: Perform Home Exercise Program - Progress: Progressing toward goal PT Short Term Goals PT Short Term Goal 1: patient will be able to extend cervical spine to 30 degrees with no pain.  PT Short Term Goal 1 - Progress: Met PT Short Term Goal 2: patient will have head aches of <2 times daily PT Short Term Goal 2 - Progress: Met PT Short Term Goal 3: patient deep neck flexor strength will demostrate improving endurance with ability to hold head off table in supine  in chin tuck position >20 seconds demosntratign improved ability to maintain good posture during work. PT Short Term Goal 3 - Progress: Met PT Short Term Goal 4: Patient will be able to rotate head 60 degrees bilateral with no pain at end range PT Short Term Goal 4 - Progress: Met PT Long Term Goals PT Long Term Goal 1: Patient will be pain free with all cervical spine AROM PT Long Term Goal 1 - Progress: Met  PT Long Term Goal 2: patient will expeience head aches of less than 3 times weekly PT Long Term Goal 2 - Progress: Met (Headaches once every 2-3 weeks) Long Term Goal 3: patient deep neck flexor strength will demostrate improving endurance with ability to hold head off table  in supine  in chin tuck position >60 seconds demosntratign improved ability to maintain good posture during work.  Problem List Patient Active Problem List   Diagnosis Date Noted  . Headache(784.0) 01/22/2014  . Meralgia paresthetica 12/17/2013    PT - End of Session Activity Tolerance: Patient tolerated treatment well General Behavior During Therapy: [facility] for tasks assessed/performed  GP    Aldona Lento 04/01/2014, 2:15 PM  Physician Documentation Your signature is required to indicate approval of the treatment plan as stated above.  Please sign and either send electronically or make a copy of this report for your files and return this physician signed original.   Please mark one 1.__approve of plan  2. ___approve of plan with the following conditions.   ______________________________                                                          _____________________ Physician Signature                                                                                                             Date

## 2014-04-03 ENCOUNTER — Ambulatory Visit (HOSPITAL_COMMUNITY): Payer: 59

## 2014-04-03 NOTE — Progress Notes (Signed)
Physical Therapy Re-evaluation/Treatment  Patient Details  Name: Sherri Alvarado MRN: [id number] Date of Birth: 1965-12-16  Today's Date: 04/01/2014 Time: 1030-1105 PT Time Calculation (min): 35 min Charge: MMT/ROM 1030-1040, Manual 1045-1105               Visit#: 8 of 12  Re-eval: 04/29/14 Assessment Diagnosis: Limited cervical spine mobility secondary to pain, weakness  Next MD Visit: Summer August 2015 Prior Therapy: no   Authorization: [employer]    Authorization Time Period:    Authorization Visit#: 8 of 12   Subjective Symptoms/Limitations Symptoms: Pt stated neck is feeling much better.   Pain Assessment Currently in Pain?: Yes Pain Score: 5  Pain Location: Neck Pain Orientation: Left  Objective:   Sensation/Coordination/Flexibility/Functional Tests Functional Tests Functional Tests: FOTO: Currently 50/50 was 50% limitation, 50% function  Assessment Cervical AROM Cervical Flexion: 55 Cervical Extension: 45 (was 43) Cervical - Right Side Bend: 42 (was 40) Cervical - Left Side Bend: 45 (was 43) Cervical - Right Rotation: 70 (was 64) Cervical - Left Rotation: 70 (was 65) Cervical Strength Cervical Flexion: 4/5 (was 3/5) Cervical Extension: 5/5 (was 3/5) Palpation Palpation: tenderness in Uppertrapezius  Exercise/Treatments Stretches Upper Trapezius Stretch: 3 reps;30 seconds Levator Stretch: 3 reps;20 seconds Other Neck Stretches: 3D neck stretch with shoulder distraction 10x 3 seconds in sitting and standing Supine Exercises Other Supine Exercise: Deep neck flexor lift 3 reps with 20" max  Manual Therapy Manual Therapy: Massage Massage: Upper traps, scalenes, splenius capitis, levator scapul, massater, temporal lobs, pectoralis major gentle manual traction.  Physical Therapy Assessment and Plan PT Assessment and Plan Clinical Impression Statement: Reassessment complete with the following findings:  Pt able to demonstrate appropriate form and  technqiues with stretches and exercises, reports she has not been as compliant with HEP lately since school has been out with 3 kids.  Pt reports interest in breast reduction for pain control, pt encouraged to talk to MD.  Cervical ROM WFL wtih c/o pain end range Rt and Lt with visible upper trapezius tightness.  Neck strength is improving, pt able to hold for 20" deep neck flexor prior limited by fatigue.  Pt reoprts headached have decreased in frequency to once every 2 weeks.  Recommend continuing OPPT for 1x week for 4 more weeks to improve strength, muscle lengthening with streches and manual technqiues to reduce spasms and headaches.   PT Plan: Recommend continuiong OPPTfor 4 more weeks x1 time a week for pt's schedule.     Goals Home Exercise Program PT Goal: Perform Home Exercise Program - Progress: Progressing toward goal PT Short Term Goals PT Short Term Goal 1: patient will be able to extend cervical spine to 30 degrees with no pain.  PT Short Term Goal 1 - Progress: Met PT Short Term Goal 2: patient will have head aches of <2 times daily PT Short Term Goal 2 - Progress: Met PT Short Term Goal 3: patient deep neck flexor strength will demostrate improving endurance with ability to hold head off table in supine  in chin tuck position >20 seconds demosntratign improved ability to maintain good posture during work. PT Short Term Goal 3 - Progress: Met PT Short Term Goal 4: Patient will be able to rotate head 60 degrees bilateral with no pain at end range PT Short Term Goal 4 - Progress: Met PT Long Term Goals PT Long Term Goal 1: Patient will be pain free with all cervical spine AROM PT Long Term Goal 1 - Progress: Met  PT Long Term Goal 2: patient will expeience head aches of less than 3 times weekly PT Long Term Goal 2 - Progress: Met (Headaches once every 2-3 weeks) Long Term Goal 3: patient deep neck flexor strength will demostrate improving endurance with ability to hold head off table  in supine  in chin tuck position >60 seconds demosntratign improved ability to maintain good posture during work.  Problem List Patient Active Problem List   Diagnosis Date Noted  . Headache(784.0) 01/22/2014  . Meralgia paresthetica 12/17/2013    PT - End of Session Activity Tolerance: Patient tolerated treatment well General Behavior During Therapy: [facility] for tasks assessed/performed  GP    Aldona Lento 04/01/2014, 2:15 PM  Devona Konig PT DPT  Physician Documentation Your signature is required to indicate approval of the treatment plan as stated above.  Please sign and either send electronically or make a copy of this report for your files and return this physician signed original.   Please mark one 1.__approve of plan  2. ___approve of plan with the following conditions.   ______________________________                                                          _____________________ Physician Signature                                                                                                             Date

## 2014-04-10 ENCOUNTER — Ambulatory Visit (HOSPITAL_COMMUNITY): Payer: 59 | Admitting: Physical Therapy

## 2014-04-10 ENCOUNTER — Telehealth: Payer: Self-pay | Admitting: Neurology

## 2014-04-10 MED ORDER — GABAPENTIN 100 MG PO CAPS: 100.0000 mg | ORAL_CAPSULE | Freq: Three times a day (TID) | ORAL | Status: DC

## 2014-04-10 MED ORDER — TOPIRAMATE 25 MG PO TABS: 25.0000 mg | ORAL_TABLET | Freq: Two times a day (BID) | ORAL | Status: DC

## 2014-04-10 NOTE — Telephone Encounter (Signed)
Patient calling to state that the cost for her Gabapentin has gone up, requesting that she get a new script for 90 day supply of Gabapentin and Topamax so she can get it through her mail order pharmacy. Patient wants [place] to be aware that [facility] will be reaching out to [place] regarding this. If questions, please return call and advise.

## 2014-04-10 NOTE — Telephone Encounter (Signed)
We just received the request from [facility] for 90 day Rx's.  Prescriptions have been sent.

## 2014-04-12 ENCOUNTER — Ambulatory Visit (HOSPITAL_COMMUNITY): Payer: 59

## 2014-04-15 ENCOUNTER — Ambulatory Visit (HOSPITAL_COMMUNITY): Payer: 59

## 2014-04-22 ENCOUNTER — Ambulatory Visit: Payer: 59 | Admitting: Neurology

## 2014-04-23 ENCOUNTER — Ambulatory Visit (HOSPITAL_COMMUNITY)
Admission: RE | Admit: 2014-04-23 | Discharge: 2014-04-23 | Disposition: A | Discharge status: Home / Self Care | Payer: 59 | Source: Ambulatory Visit | Attending: Neurology | Admitting: Neurology

## 2014-04-23 DIAGNOSIS — IMO0001 Reserved for inherently not codable concepts without codable children: Secondary | ICD-10-CM | POA: Diagnosis not present

## 2014-04-23 DIAGNOSIS — M542 Cervicalgia: Secondary | ICD-10-CM | POA: Insufficient documentation

## 2014-04-23 DIAGNOSIS — R209 Unspecified disturbances of skin sensation: Secondary | ICD-10-CM | POA: Diagnosis not present

## 2014-04-23 DIAGNOSIS — R29898 Other symptoms and signs involving the musculoskeletal system: Secondary | ICD-10-CM | POA: Insufficient documentation

## 2014-04-23 DIAGNOSIS — R51 Headache: Secondary | ICD-10-CM | POA: Insufficient documentation

## 2014-04-23 NOTE — Progress Notes (Signed)
Physical Therapy Treatment Patient Details  Name: Sherri Alvarado MRN: [id number] Date of Birth: July 20, 1966  Today's Date: 04/23/2014 Time: 1018-1058 PT Time Calculation (min): 40 min Visit#: 9 of 12  Re-eval: 04/29/14 Authorization: [facility]  Charges:  therex [id number] (24') manual 1042-1058 (16')  Subjective: Symptoms/Limitations Symptoms: Pt states therapy is helping.  States she feel at [facility] last week and exaccerbated her symptoms.   Pain Assessment Currently in Pain?: Yes Pain Score: 5  Pain Location: Neck Pain Orientation: Left   Exercise/Treatments Stretches Upper Trapezius Stretch: 3 reps;30 seconds Levator Stretch: 3 reps;20 seconds Other Neck Stretches: 3D neck stretch with shoulder distraction 10x 3 seconds in sitting and standing Machines for Strengthening UBE (Upper Arm Bike): 4 minutes backward level 3  Manual Therapy Manual Therapy: Massage Massage: Upper traps, scalenes, splenius capitis, levator scapula in prone lying  Physical Therapy Assessment and Plan PT Assessment and Plan Clinical Impression Statement: Pt has missed X 2 weeks of therapy.  Educated on importance of continuing HEP and completing scheduled appointments.  Pt verbalized understanding.  Pt with noted tightness in Lt upper trap and C7 region with manual techniques.  Pt reported overall improvment with reduction of symptoms at end of session.      Problem List Patient Active Problem List   Diagnosis Date Noted  . Headache(784.0) 01/22/2014  . Meralgia paresthetica 12/17/2013       Lurena Nida, PTA/CLT 04/23/2014, 3:28 PM

## 2014-04-29 ENCOUNTER — Ambulatory Visit (HOSPITAL_COMMUNITY): Payer: 59 | Admitting: Physical Therapy

## 2014-05-01 ENCOUNTER — Encounter: Payer: Self-pay | Admitting: Neurology

## 2014-05-01 ENCOUNTER — Ambulatory Visit (INDEPENDENT_AMBULATORY_CARE_PROVIDER_SITE_OTHER): Payer: 59 | Admitting: Neurology

## 2014-05-01 VITALS — BP 111/71 | HR 81 | Ht 65.5 in | Wt 242.0 lb

## 2014-05-01 DIAGNOSIS — G5710 Meralgia paresthetica, unspecified lower limb: Secondary | ICD-10-CM

## 2014-05-01 DIAGNOSIS — R51 Headache: Secondary | ICD-10-CM

## 2014-05-01 NOTE — Progress Notes (Signed)
[facility]    Provider:  Dr Hosie PoissonSumner Referring Provider: Ardyth ManPark, Chan M, MD Primary Care Physician:  Ardyth ManPARK,CHAN M, MD  CC:  Question of MS  HPI:  Sherri Alvarado is a 48 y.o. female here as a follow up from Dr. Willaim BanePark for headache and paresthesias. Since last visit she has had a LP done which showed opening pressure of 24. She was started on Topamax 25mg  BID with good improvement in her symptoms. She was also started on gabapentin for suspected meralgia paresthetica. Overall states she is doing well since last visit. States that her headaches and vision have improved. Has been evaluated by eye doctor, told that her VF are normal and overall it looks good. Tolerating the topamax well, no adverse effects noted. She states her goal is to eventually get off the Topamax. She is working on dieting, losing weight and wants to use a more natural approach.   She is considering having breast reduction surgery to help relieve some of the strain on her back and neck.   Initial visit 12/2013: Patient reports history of sensory changes, pain, headaches, change in peripheral vision. Had MRI brain done which showed "several small scattered areas of increased flare signal w/in the white matter, not consistent with MS, although cannot be excluded". Since March 6 has noticed some numbness and aching in her left leg. Feels symptoms are getting worse. Notes symptoms are just located in the area of the lateral femoral cutaneous nerve on the left. Denies any radiating symptoms distally. No weakness. No symptoms in RLE or bilateral UE. Notes in the past year has gained around 15 pounds.   She reports her mother has MS. Notes increased stress/anxiety in her life. Lost both her mother and sister in the past year, currently undergoing a divorce. Has a stressful job.  Has been out of work recently and feels that has helped her symptoms.   Notes her headaches have increased, typically occuring 3-4  times a week. Notes as a pressure type pain in her occipital region, can last few minutes up to an hour or so. No nausea or emesis. Mild phonophobia. Recently saw an eye doctor for floaters who told her she has MS, unclear based on what findings, there was some question of decreased VF in left inferior quadrant. Notes her left eye is tender to touch.   Review of Systems: Out of a complete 14 system review, the patient complains of only the following symptoms, and all other reviewed systems are negative. + fatigue, eye pain, easy bruising, memory loss, headache, numbness, aching muscles  History   Social History  . Marital Status: Legally Separated    Spouse Name: N/A    Number of Children: N/A  . Years of Education: N/A   Occupational History  . Not on file.   Social History Main Topics  . Smoking status: Never Smoker   . Smokeless tobacco: Never Used  . Alcohol Use: Yes     Comment: occ around holidays  . Drug Use: Yes    Special: Marijuana     Comment: occ  . Sexual Activity: Not on file   Other Topics Concern  . Not on file   Social History Narrative   Separated, 2 children   Right handed   3 yrs of college   1 cup 3 times a week    Family History  Problem Relation Age of Onset  . Multiple sclerosis Mother   . Heart murmur Mother   .  Hypertension Mother   . Kidney disease Father   . Cancer - Other Father     throat    No past medical history on file.  Past Surgical History  Procedure Laterality Date  . None      Current Outpatient Prescriptions  Medication Sig Dispense Refill  . ALPRAZolam (XANAX) 1 MG tablet       . gabapentin (NEURONTIN) 100 MG capsule Take 1 capsule (100 mg total) by mouth 3 (three) times daily.  270 capsule  0  . ibuprofen (ADVIL,MOTRIN) 800 MG tablet       . topiramate (TOPAMAX) 25 MG tablet Take 1 tablet (25 mg total) by mouth 2 (two) times daily.  180 tablet  0  . TRINESSA, 28, 0.18/0.215/0.25 MG-35 MCG tablet        No current  facility-administered medications for this visit.    Allergies as of 05/01/2014  . (No Known Allergies)    Vitals: There were no vitals taken for this visit. Last Weight:  Wt Readings from Last 1 Encounters:  01/22/14 251 lb (113.853 kg)   Last Height:   Ht Readings from Last 1 Encounters:  01/22/14 5\' 6"  (1.676 m)     Physical exam: Exam: Gen: NAD, conversant Eyes: anicteric sclerae, moist conjunctivae HENT: Atraumatic, oropharynx clear Neck: Trachea midline; supple,  Lungs: CTA, no wheezing, rales, rhonic                          CV: RRR, no MRG Abdomen: Soft, non-tender;  Extremities: No peripheral edema  Skin: Normal temperature, no rash,  Psych: Appropriate affect, pleasant  Neuro: MS: AA&Ox3, appropriately interactive, normal affect   Speech: fluent w/o paraphasic error  Memory: good recent and remote recall  CN: PERRL, unable to visualize optic discs due to pupil size, VFF to [facility] with possible slight decrease in inferior lateral quadrant of OS, EOMI no nystagmus, no ptosis, sensation intact to LT V1-V3 bilat, face symmetric, no weakness, hearing grossly intact, palate elevates symmetrically, shoulder shrug 5/5 bilat,  tongue protrudes midline, no fasiculations noted.  Motor: normal bulk and tone Strength: 5/5  In all extremities  Coord: rapid alternating and point-to-point (FNF, HTS) movements intact.  Reflexes: symmetrical, bilat downgoing toes  Sens: decreased LT in distribution of lateral femoral cutaneous nerve of left upper extremity  Gait: posture, stance, stride and arm-swing normal. Tandem gait intact. Able to walk on heels and toes. Romberg absent.   Assessment:  After physical and neurologic examination, review of laboratory studies, imaging, neurophysiology testing and pre-existing records, assessment will be reviewed on the problem list.  Plan:  Treatment plan and additional workup will be reviewed under Problem List.  1)IIH 2)meralgia  paresthetica   47y/o woman presenting for follow up evaluation of IIH and meralgia paresthetica. She is overall doing well. Getting good benefit from Topamax, headaches have improved and vision is stable. Continues to follow up with her eye doctor for monitoring. She is dieting and exercising and has been losing weight. Her long term goal is to be able to taper off of Topamax. Meralgia paresthetica symptoms have improved, she will taper off gabapentin. She is scheduled to follow up with plastic surgery for possible breast reduction surgery to help improve her neck/back pain. Follow up in 4 months.  [name], DO  [facility] [address] [place], [place] [address]  Phone [phone] Fax [phone]

## 2014-05-02 ENCOUNTER — Ambulatory Visit (HOSPITAL_COMMUNITY)
Admission: RE | Admit: 2014-05-02 | Discharge: 2014-05-02 | Disposition: A | Discharge status: Home / Self Care | Payer: 59 | Source: Ambulatory Visit | Attending: Neurology | Admitting: Neurology

## 2014-05-02 DIAGNOSIS — IMO0001 Reserved for inherently not codable concepts without codable children: Secondary | ICD-10-CM | POA: Diagnosis not present

## 2014-05-02 NOTE — Progress Notes (Signed)
Physical Therapy Treatment Patient Details  Name: Sherri Alvarado MRN: [id number] Date of Birth: [birth year]  Today's Date: 05/02/2014 Time: [phone] PT Time Calculation (min): 40 min     Charges: TE [phone] Visit#: 10 of 12  Re-eval: 04/29/14 Assessment Diagnosis: Limited cervical spine mobility secondary to pain, weakness  Next MD Visit: Summer August 2015 Prior Therapy: no   Authorization: [employer]  Authorization Time Period:    Authorization Visit#: 10 of 12   Subjective: Symptoms/Limitations Symptoms: Patient states she is considerering a breast reduction to decrease strain on neck. no low back pain since weight loss. 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 Cervical pain due to possible disc pathology. Patient has had other symptoms including neck shoulder, low back, pain in Lt eye and head aches that may be due to another factor resulting in doctor to want to due a spinal tap to determine if the head aches,  eye pain, and neck pain are related. Patient has halready been screened for MS by doctor via MRi which was ruled out.  Patient works a Health and safety inspectordesk job  during [employer] she notes neck paina nd headaches. MRI: C2-3 disc bulge Pain Assessment Currently in Pain?: Yes Pain Score: 5  Pain Location: Neck Pain Orientation: Left Pain Relieving Factors: radiates to shoulders,  Exercise/Treatments 3D cervical spine excursion 10x each 2-3 seconds Standing Exercises Other Standing Exercises: Rows and back hand lunges 10x each with red and green T-band 10x, Squat reach matrix 5x wit 3 lb dumbbell Other Standing Exercises: Cervical retractions Seated Exercises Other Seated Exercise: 3D thoracic spine excursion with overhead reach with 3lb dumbbell 10x each  Supine Exercises Bent leg lift 10x   Bilateral bent knee raise to straight leg lower 10x  Physical Therapy Assessment and Plan PT Assessment and Plan Clinical Impression Statement: Pt has missed X 2 weeks of therapy. Educated on importance of continuing HEP and completing scheduled appointments. Pt verbalized understanding. Patient notes aa busy schedule and difficulty attendig PT though she has noticed improvements with performance of HEP. this session focused on educating patient on performance of postural strengthening exercises.  PT Plan: Continue 2 more weeks, then re-evaluate.  (pt has missed passed 2 weeks of therapy).  Focus on improving posture: decreasing lumbar lordosis, thoracic kyphosis, forward head posture, and forward rounded shoulders. Add hip flexor stretch    Goals    Problem List Patient Active Problem List   Diagnosis Date Noted  . Headache(784.0) 01/22/2014  . Meralgia paresthetica 12/17/2013       Gwendelyn Lanting R 05/02/2014, 10:27 AM

## 2014-05-03 ENCOUNTER — Telehealth: Payer: Self-pay | Admitting: Neurology

## 2014-05-03 NOTE — Telephone Encounter (Signed)
Patient was called and the appointment was moved to November with Dr. Vickey Hugerohmeier.

## 2014-05-03 NOTE — Telephone Encounter (Signed)
Patient requesting sooner appointment with Dr. Brett Fairy, states that she wants one in November due to insurance reasons because she has met her deductible. Please call patient and advise.

## 2014-05-06 ENCOUNTER — Ambulatory Visit (HOSPITAL_COMMUNITY): Payer: 59 | Admitting: Physical Therapy

## 2014-05-08 ENCOUNTER — Ambulatory Visit (HOSPITAL_COMMUNITY): Payer: 59 | Admitting: Physical Therapy

## 2014-05-14 ENCOUNTER — Ambulatory Visit (HOSPITAL_COMMUNITY)
Admission: RE | Admit: 2014-05-14 | Discharge: 2014-05-14 | Disposition: A | Discharge status: Home / Self Care | Payer: 59 | Source: Ambulatory Visit | Attending: Neurology | Admitting: Neurology

## 2014-05-14 DIAGNOSIS — M542 Cervicalgia: Secondary | ICD-10-CM | POA: Diagnosis not present

## 2014-05-14 DIAGNOSIS — R51 Headache: Secondary | ICD-10-CM | POA: Insufficient documentation

## 2014-05-14 DIAGNOSIS — IMO0001 Reserved for inherently not codable concepts without codable children: Secondary | ICD-10-CM | POA: Diagnosis present

## 2014-05-14 DIAGNOSIS — R209 Unspecified disturbances of skin sensation: Secondary | ICD-10-CM | POA: Diagnosis not present

## 2014-05-14 DIAGNOSIS — R29898 Other symptoms and signs involving the musculoskeletal system: Secondary | ICD-10-CM | POA: Diagnosis not present

## 2014-05-14 DIAGNOSIS — N62 Hypertrophy of breast: Secondary | ICD-10-CM

## 2014-05-14 HISTORY — DX: Hypertrophy of breast: N62

## 2014-05-14 NOTE — Progress Notes (Signed)
Physical Therapy Re-evaluation/Treatment Note/Discharge summary  Patient Details  Name: Sherri Alvarado MRN: [id number] Date of Birth: Jan 22, 1966  Today's Date: 05/14/2014 Time: 1104-1200 PT Time Calculation (min): 71 min Charge: TE 4098-1191, Manual 1145-1200              Visit#: 11 of 12  Re-eval: 04/29/14 Assessment Diagnosis: Limited cervical spine mobility secondary to pain, weakness  Next MD Visit: Janann Colonel to [place] in November Prior Therapy: no   Authorization: [employer]    Authorization Time Period:    Authorization Visit#: 11 of 12   Subjective Symptoms/Limitations Symptoms: Reports of increased ease with ROM, decreased pain.  Compliant with HEP 2x a week.  Pt went to neurologist and has decided to consider breast reduction in October. Pain Assessment Currently in Pain?: Yes Pain Score: 4  Pain Location: Neck Pain Orientation: Left  Cognition/Observation Observation/Other Assessments Observations: Posture:  Able to demonstrate appropraite posture landmarks with slight forward head, normalized lordacic curvature (Posture: excessive lumbar spine lordosis, excessive thoracic)  Sensation/Coordination/Flexibility/Functional Tests Functional Tests Functional Tests: FOTO: Currently 68% with 32% limitation (was 50% limitation, 50% function)  Assessment Cervical AROM Cervical Flexion: WNL Cervical Extension: WNL Cervical - Right Side Bend: WNL Cervical - Left Side Bend: WNL Cervical - Right Rotation: WNL Cervical - Left Rotation: WNL Cervical Strength Cervical Flexion:  (4+/5 was 4/5) Cervical Extension: 5/5 Palpation Palpation: tenderness in Uppertrapezius (tenderness in Uppertrapezius)  Exercise/Treatments Stretches Upper Trapezius Stretch: 3 reps;30 seconds Levator Stretch: 3 reps;20 seconds Other Neck Stretches: 3D neck stretch with shoulder distraction 10x 3 seconds in sitting and standing Standing Exercises Other Standing Exercises: 3D scapular  mobiilty exercises with 3# dolerod Seated Exercises Other Seated Exercise: 3D thoracic spine excursion with overhead reach with 3lb dumbbell 10x each   Manual Therapy Manual Therapy: Massage Massage: Upper traps, pects, scalenes, cervical musculature with suboccipital release 3x in supine with LE elevated  Physical Therapy Assessment and Plan PT Assessment and Plan Clinical Impression Statement: Reassessment complete with the following findings:  Pt reports compliance with HEP 2-3x a week, able to demonstrate appropraite form and technique.  ROM WNL and improved cervical strength to [facility].  Pt able to demonstrate 36" hold with deep neck flexor musculautre with no reports of increased pain.  Ended session with manual techniques to reduce tension of pectoralis, upper traps and overall cervical musculature; noted decreased tension with suboccipital release.  Pt educated on importance of continuing HEP and given [employer] membership form to continue at home.  Pt improved percieved functioanl abilities by almost 20 points per FOTO score.   PT Plan: D/C to HEP    Goals Home Exercise Program PT Goal: Perform Home Exercise Program - Progress:  (2-3x weekly) PT Short Term Goals PT Short Term Goal 1: patient will be able to extend cervical spine to 30 degrees with no pain.  PT Short Term Goal 1 - Progress: Met PT Short Term Goal 2: patient will have head aches of <2 times daily PT Short Term Goal 2 - Progress: Met PT Short Term Goal 3: patient deep neck flexor strength will demostrate improving endurance with ability to hold head off table in supine  in chin tuck position >20 seconds demosntratign improved ability to maintain good posture during work. PT Short Term Goal 3 - Progress: Met PT Short Term Goal 4: Patient will be able to rotate head 60 degrees bilateral with no pain at end range PT Short Term Goal 4 - Progress: Met PT Long Term Goals PT  Long Term Goal 1: Patient will be pain free with all  cervical spine AROM PT Long Term Goal 1 - Progress: Met PT Long Term Goal 2: patient will expeience head aches of less than 3 times weekly PT Long Term Goal 2 - Progress: Met Long Term Goal 3: patient deep neck flexor strength will demostrate improving endurance with ability to hold head off table in supine  in chin tuck position >60 seconds demosntratign improved ability to maintain good posture during work. Long Term Goal 3 Progress: Progressing toward goal  Problem List Patient Active Problem List   Diagnosis Date Noted  . Headache(784.0) 01/22/2014  . Meralgia paresthetica 12/17/2013    PT - End of Session Activity Tolerance: Patient tolerated treatment well General Behavior During Therapy: [facility] for tasks assessed/performed  GP    Aldona Lento 05/14/2014, 4:26 PM  Physician Documentation Your signature is required to indicate approval of the treatment plan as stated above.  Please sign and either send electronically or make a copy of this report for your files and return this physician signed original.   Please mark one 1.__approve of plan  2. ___approve of plan with the following conditions.   ______________________________                                                          _____________________ Physician Signature                                                                                                             Date

## 2014-05-15 NOTE — Progress Notes (Signed)
Physical Therapy Re-evaluation/Treatment Note/Discharge summary  Patient Details  Name: Sherri Alvarado MRN: [id number] Date of Birth: 27-Nov-1965  Today's Date: 05/14/2014 Time: 1104-1200 PT Time Calculation (min): 56 min Charge: TE 3300-7622, Manual 1145-1200              Visit#: 11 of 12  Re-eval: 04/29/14 Assessment Diagnosis: Limited cervical spine mobility secondary to pain, weakness  Next MD Visit: Janann Colonel to [place] in November Prior Therapy: no   Authorization: [employer]    Authorization Visit#: 11 of 12   Subjective Symptoms/Limitations Symptoms: Reports of increased ease with ROM, decreased pain.  Compliant with HEP 2x a week.  Pt went to neurologist and has decided to consider breast reduction in October. Pain Assessment Currently in Pain?: Yes Pain Score: 4  Pain Location: Neck Pain Orientation: Left  Cognition/Observation Observation/Other Assessments Observations: Posture:  Able to demonstrate appropraite posture landmarks with slight forward head, normalized lordacic curvature (Posture: excessive lumbar spine lordosis, excessive thoracic)  Sensation/Coordination/Flexibility/Functional Tests Functional Tests Functional Tests: FOTO: Currently 68% with 32% limitation (was 50% limitation, 50% function)  Assessment Cervical AROM Cervical Flexion: WNL Cervical Extension: WNL Cervical - Right Side Bend: WNL Cervical - Left Side Bend: WNL Cervical - Right Rotation: WNL Cervical - Left Rotation: WNL Cervical Strength Cervical Flexion:  (4+/5 was 4/5) Cervical Extension: 5/5 Palpation Palpation: tenderness in Uppertrapezius (tenderness in Uppertrapezius)  Exercise/Treatments Stretches Upper Trapezius Stretch: 3 reps;30 seconds Levator Stretch: 3 reps;20 seconds Other Neck Stretches: 3D neck stretch with shoulder distraction 10x 3 seconds in sitting and standing Standing Exercises Other Standing Exercises: 3D scapular mobiilty exercises with 3#  dolerod Seated Exercises Other Seated Exercise: 3D thoracic spine excursion with overhead reach with 3lb dumbbell 10x each   Manual Therapy Manual Therapy: Massage Massage: Upper traps, pects, scalenes, cervical musculature with suboccipital release 3x in supine with LE elevated  Physical Therapy Assessment and Plan PT Assessment and Plan Clinical Impression Statement: Reassessment complete with the following findings:  Pt reports compliance with HEP 2-3x a week, able to demonstrate appropraite form and technique.  ROM WNL and improved cervical strength to [facility].  Pt able to demonstrate 36" hold with deep neck flexor musculautre with no reports of increased pain.  Ended session with manual techniques to reduce tension of pectoralis, upper traps and overall cervical musculature; noted decreased tension with suboccipital release.  Pt educated on importance of continuing HEP and given [employer] membership form to continue at home.  Pt improved percieved functioanl abilities by almost 20 points per FOTO score.   PT Plan: D/C to HEP    Goals Home Exercise Program PT Goal: Perform Home Exercise Program - Progress:  (2-3x weekly) PT Short Term Goals PT Short Term Goal 1: patient will be able to extend cervical spine to 30 degrees with no pain.  PT Short Term Goal 1 - Progress: Met PT Short Term Goal 2: patient will have head aches of <2 times daily PT Short Term Goal 2 - Progress: Met PT Short Term Goal 3: patient deep neck flexor strength will demostrate improving endurance with ability to hold head off table in supine  in chin tuck position >20 seconds demosntratign improved ability to maintain good posture during work. PT Short Term Goal 3 - Progress: Met PT Short Term Goal 4: Patient will be able to rotate head 60 degrees bilateral with no pain at end range PT Short Term Goal 4 - Progress: Met PT Long Term Goals PT Long Term Goal 1: Patient will  be pain free with all cervical spine AROM PT Long Term  Goal 1 - Progress: Met PT Long Term Goal 2: patient will expeience head aches of less than 3 times weekly PT Long Term Goal 2 - Progress: Met Long Term Goal 3: patient deep neck flexor strength will demostrate improving endurance with ability to hold head off table in supine  in chin tuck position >60 seconds demosntratign improved ability to maintain good posture during work. Long Term Goal 3 Progress: Progressing toward goal  Problem List Patient Active Problem List   Diagnosis Date Noted  . Headache(784.0) 01/22/2014  . Meralgia paresthetica 12/17/2013    PT - End of Session Activity Tolerance: Patient tolerated treatment well General Behavior During Therapy: [facility] for tasks assessed/performed  GP    Aldona Lento 05/14/2014, 4:26 PM  Devona Konig PT DPT  Physician Documentation Your signature is required to indicate approval of the treatment plan as stated above.  Please sign and either send electronically or make a copy of this report for your files and return this physician signed original.   Please mark one 1.__approve of plan  2. ___approve of plan with the following conditions.   ______________________________                                                          _____________________ Physician Signature                                                                                                             Date

## 2014-05-21 ENCOUNTER — Ambulatory Visit (HOSPITAL_COMMUNITY): Payer: 59 | Admitting: Physical Therapy

## 2014-05-29 ENCOUNTER — Encounter: Payer: Self-pay | Admitting: Neurology

## 2014-05-29 ENCOUNTER — Telehealth: Payer: Self-pay | Admitting: Neurology

## 2014-05-29 NOTE — Telephone Encounter (Signed)
Letter has been faxed.

## 2014-05-29 NOTE — Telephone Encounter (Signed)
Patient called and stated she needs a letter of clearance for breast reduction surgery.  She relayed that she discussed it at her appointment with doctor on 05-01-14.  The surgeon is Lamar Blinks. Contogiannis and her fax is [phone], surgery date is 06-11-14.

## 2014-05-31 NOTE — Telephone Encounter (Signed)
Thank you, noted.

## 2014-06-04 DIAGNOSIS — K08409 Partial loss of teeth, unspecified cause, unspecified class: Secondary | ICD-10-CM

## 2014-06-04 HISTORY — DX: Partial loss of teeth, unspecified cause, unspecified class: K08.409

## 2014-06-06 ENCOUNTER — Encounter (HOSPITAL_BASED_OUTPATIENT_CLINIC_OR_DEPARTMENT_OTHER): Payer: Self-pay | Admitting: *Deleted

## 2014-06-11 ENCOUNTER — Ambulatory Visit (HOSPITAL_BASED_OUTPATIENT_CLINIC_OR_DEPARTMENT_OTHER): Payer: 59 | Admitting: Anesthesiology

## 2014-06-11 ENCOUNTER — Encounter (HOSPITAL_BASED_OUTPATIENT_CLINIC_OR_DEPARTMENT_OTHER)
Admission: RE | Disposition: A | Discharge status: Home / Self Care | Payer: Self-pay | Source: Ambulatory Visit | Attending: Plastic Surgery

## 2014-06-11 ENCOUNTER — Ambulatory Visit (HOSPITAL_BASED_OUTPATIENT_CLINIC_OR_DEPARTMENT_OTHER)
Admission: RE | Admit: 2014-06-11 | Discharge: 2014-06-11 | Disposition: A | Discharge status: Home / Self Care | Payer: 59 | Source: Ambulatory Visit | Attending: Plastic Surgery | Admitting: Plastic Surgery

## 2014-06-11 ENCOUNTER — Encounter (HOSPITAL_BASED_OUTPATIENT_CLINIC_OR_DEPARTMENT_OTHER): Payer: 59 | Admitting: Anesthesiology

## 2014-06-11 ENCOUNTER — Encounter (HOSPITAL_BASED_OUTPATIENT_CLINIC_OR_DEPARTMENT_OTHER): Payer: Self-pay

## 2014-06-11 DIAGNOSIS — N62 Hypertrophy of breast: Secondary | ICD-10-CM | POA: Diagnosis present

## 2014-06-11 DIAGNOSIS — Z6841 Body Mass Index (BMI) 40.0 and over, adult: Secondary | ICD-10-CM | POA: Diagnosis not present

## 2014-06-11 HISTORY — DX: Dental restoration status: Z98.811

## 2014-06-11 HISTORY — DX: Hypertrophy of breast: N62

## 2014-06-11 HISTORY — PX: BREAST REDUCTION SURGERY: SHX8

## 2014-06-11 HISTORY — DX: Benign intracranial hypertension: G93.2

## 2014-06-11 HISTORY — DX: Partial loss of teeth, unspecified cause, unspecified class: K08.409

## 2014-06-11 LAB — POCT HEMOGLOBIN-HEMACUE: HEMOGLOBIN: 11.9 g/dL — AB (ref 12.0–15.0)

## 2014-06-11 SURGERY — MAMMOPLASTY, REDUCTION
Anesthesia: General | Site: Breast | Laterality: Bilateral

## 2014-06-11 MED ORDER — LACTATED RINGERS IV SOLN
INTRAVENOUS | Status: DC
  Administered 2014-06-11 (×4): via INTRAVENOUS

## 2014-06-11 MED ORDER — FENTANYL CITRATE 0.05 MG/ML IJ SOLN
INTRAMUSCULAR | Status: AC
  Filled 2014-06-11: qty 20

## 2014-06-11 MED ORDER — BUPIVACAINE LIPOSOME 1.3 % IJ SUSP
INTRAMUSCULAR | Status: AC
  Filled 2014-06-11: qty 20

## 2014-06-11 MED ORDER — HYDROMORPHONE HCL 1 MG/ML IJ SOLN
INTRAMUSCULAR | Status: AC
  Filled 2014-06-11: qty 1

## 2014-06-11 MED ORDER — LIDOCAINE-EPINEPHRINE 1 %-1:100000 IJ SOLN
INTRAMUSCULAR | Status: AC
  Filled 2014-06-11: qty 2

## 2014-06-11 MED ORDER — MIDAZOLAM HCL 2 MG/2ML IJ SOLN: 1.0000 mg | INTRAMUSCULAR | Status: DC | PRN

## 2014-06-11 MED ORDER — PROPOFOL 10 MG/ML IV BOLUS
INTRAVENOUS | Status: AC
  Filled 2014-06-11: qty 40

## 2014-06-11 MED ORDER — MIDAZOLAM HCL 2 MG/2ML IJ SOLN
INTRAMUSCULAR | Status: AC
  Filled 2014-06-11: qty 2

## 2014-06-11 MED ORDER — FENTANYL CITRATE 0.05 MG/ML IJ SOLN: 50.0000 ug | INTRAMUSCULAR | Status: DC | PRN

## 2014-06-11 MED ORDER — MIDAZOLAM HCL 2 MG/ML PO SYRP: 12.0000 mg | ORAL_SOLUTION | Freq: Once | ORAL | Status: DC | PRN

## 2014-06-11 MED ORDER — SCOPOLAMINE 1 MG/3DAYS TD PT72
MEDICATED_PATCH | TRANSDERMAL | Status: AC
  Filled 2014-06-11: qty 1

## 2014-06-11 MED ORDER — OXYCODONE HCL 5 MG PO TABS
ORAL_TABLET | ORAL | Status: AC
  Filled 2014-06-11: qty 1

## 2014-06-11 MED ORDER — HYDROMORPHONE HCL 1 MG/ML IJ SOLN
0.2500 mg | INTRAMUSCULAR | Status: DC | PRN
  Administered 2014-06-11 (×2): 0.5 mg via INTRAVENOUS

## 2014-06-11 MED ORDER — SCOPOLAMINE 1 MG/3DAYS TD PT72
1.0000 | MEDICATED_PATCH | Freq: Once | TRANSDERMAL | Status: AC
  Administered 2014-06-11: 1 via TRANSDERMAL

## 2014-06-11 MED ORDER — LIDOCAINE-EPINEPHRINE 1 %-1:100000 IJ SOLN
INTRAMUSCULAR | Status: DC | PRN
  Administered 2014-06-11: 40 mL

## 2014-06-11 MED ORDER — CEFAZOLIN SODIUM 1-5 GM-% IV SOLN
1.0000 g | Freq: Once | INTRAVENOUS | Status: AC
  Administered 2014-06-11: 2 g via INTRAVENOUS

## 2014-06-11 MED ORDER — ONDANSETRON HCL 4 MG/2ML IJ SOLN
INTRAMUSCULAR | Status: DC | PRN
Start: 2014-06-11 — End: 2014-06-11
  Administered 2014-06-11: 4 mg via INTRAVENOUS

## 2014-06-11 MED ORDER — BACITRACIN ZINC 500 UNIT/GM EX OINT
TOPICAL_OINTMENT | CUTANEOUS | Status: AC
  Filled 2014-06-11: qty 28.35

## 2014-06-11 MED ORDER — SUCCINYLCHOLINE CHLORIDE 20 MG/ML IJ SOLN
INTRAMUSCULAR | Status: DC | PRN
  Administered 2014-06-11: 100 mg via INTRAVENOUS

## 2014-06-11 MED ORDER — LIDOCAINE HCL (CARDIAC) 20 MG/ML IV SOLN
INTRAVENOUS | Status: DC | PRN
  Administered 2014-06-11: 40 mg via INTRAVENOUS

## 2014-06-11 MED ORDER — BUPIVACAINE LIPOSOME 1.3 % IJ SUSP
INTRAMUSCULAR | Status: DC | PRN
  Administered 2014-06-11: 20 mL

## 2014-06-11 MED ORDER — DEXAMETHASONE SODIUM PHOSPHATE 4 MG/ML IJ SOLN
INTRAMUSCULAR | Status: DC | PRN
  Administered 2014-06-11: 10 mg via INTRAVENOUS

## 2014-06-11 MED ORDER — OXYCODONE HCL 5 MG PO TABS
5.0000 mg | ORAL_TABLET | Freq: Once | ORAL | Status: AC | PRN
  Administered 2014-06-11: 5 mg via ORAL

## 2014-06-11 MED ORDER — BACITRACIN ZINC 500 UNIT/GM EX OINT
TOPICAL_OINTMENT | CUTANEOUS | Status: DC | PRN
  Administered 2014-06-11: 1 via TOPICAL

## 2014-06-11 MED ORDER — PROPOFOL 10 MG/ML IV BOLUS
INTRAVENOUS | Status: DC | PRN
  Administered 2014-06-11: 200 mg via INTRAVENOUS
  Administered 2014-06-11: 50 mg via INTRAVENOUS

## 2014-06-11 MED ORDER — CEFAZOLIN SODIUM-DEXTROSE 2-3 GM-% IV SOLR
INTRAVENOUS | Status: AC
  Filled 2014-06-11: qty 50

## 2014-06-11 MED ORDER — OXYCODONE HCL 5 MG/5ML PO SOLN: 5.0000 mg | Freq: Once | ORAL | Status: AC | PRN

## 2014-06-11 MED ORDER — FENTANYL CITRATE 0.05 MG/ML IJ SOLN
INTRAMUSCULAR | Status: DC | PRN
  Administered 2014-06-11 (×8): 100 ug via INTRAVENOUS

## 2014-06-11 SURGICAL SUPPLY — 63 items
BAG DECANTER FOR FLEXI CONT (MISCELLANEOUS) ×3 IMPLANT
BENZOIN TINCTURE PRP APPL 2/3 (GAUZE/BANDAGES/DRESSINGS) ×6 IMPLANT
BLADE HEX COATED 2.75 (ELECTRODE) ×3 IMPLANT
BLADE KNIFE PERSONA 10 (BLADE) ×12 IMPLANT
BLADE KNIFE PERSONA 15 (BLADE) ×9 IMPLANT
BNDG GAUZE ELAST 4 BULKY (GAUZE/BANDAGES/DRESSINGS) ×6 IMPLANT
CANISTER SUCT 1200ML W/VALVE (MISCELLANEOUS) ×6 IMPLANT
CAP BOUFFANT 24 BLUE NURSES (PROTECTIVE WEAR) ×3 IMPLANT
CLOSURE WOUND 1/2 X4 (GAUZE/BANDAGES/DRESSINGS) ×5
COVER MAYO STAND STRL (DRAPES) ×3 IMPLANT
COVER TABLE BACK 60X90 (DRAPES) ×3 IMPLANT
DECANTER SPIKE VIAL GLASS SM (MISCELLANEOUS) IMPLANT
DRAIN CHANNEL 10F 3/8 F FF (DRAIN) ×6 IMPLANT
DRAPE LAPAROSCOPIC ABDOMINAL (DRAPES) IMPLANT
DRAPE U-SHAPE 76X120 STRL (DRAPES) ×6 IMPLANT
DRSG EMULSION OIL 3X3 NADH (GAUZE/BANDAGES/DRESSINGS) ×6 IMPLANT
DRSG PAD ABDOMINAL 8X10 ST (GAUZE/BANDAGES/DRESSINGS) ×6 IMPLANT
ELECT COATED BLADE 2.86 ST (ELECTRODE) ×3 IMPLANT
ELECT REM PT RETURN 9FT ADLT (ELECTROSURGICAL) ×3
ELECTRODE REM PT RTRN 9FT ADLT (ELECTROSURGICAL) ×1 IMPLANT
EVACUATOR SILICONE 100CC (DRAIN) ×6 IMPLANT
FILTER 7/8 IN (FILTER) IMPLANT
GAUZE SPONGE 4X4 12PLY STRL (GAUZE/BANDAGES/DRESSINGS) ×6 IMPLANT
GLOVE BIO SURGEON STRL SZ 6.5 (GLOVE) IMPLANT
GLOVE BIO SURGEON STRL SZ7 (GLOVE) ×9 IMPLANT
GLOVE BIO SURGEONS STRL SZ 6.5 (GLOVE)
GLOVE BIOGEL PI IND STRL 7.0 (GLOVE) ×4 IMPLANT
GLOVE BIOGEL PI INDICATOR 7.0 (GLOVE) ×8
GLOVE ECLIPSE 6.5 STRL STRAW (GLOVE) ×15 IMPLANT
GOWN STRL REUS W/ TWL LRG LVL3 (GOWN DISPOSABLE) ×4 IMPLANT
GOWN STRL REUS W/TWL LRG LVL3 (GOWN DISPOSABLE) ×8
IV NS 250ML (IV SOLUTION) ×2
IV NS 250ML BAXH (IV SOLUTION) ×1 IMPLANT
NEEDLE HYPO 25X1 1.5 SAFETY (NEEDLE) ×9 IMPLANT
NEEDLE SPNL 18GX3.5 QUINCKE PK (NEEDLE) ×3 IMPLANT
NS IRRIG 1000ML POUR BTL (IV SOLUTION) ×9 IMPLANT
PACK BASIN DAY SURGERY FS (CUSTOM PROCEDURE TRAY) ×3 IMPLANT
PIN SAFETY STERILE (MISCELLANEOUS) ×3 IMPLANT
SCRUB PCMX 4 OZ (MISCELLANEOUS) ×3 IMPLANT
SLEEVE SCD COMPRESS KNEE MED (MISCELLANEOUS) ×3 IMPLANT
SPECIMEN JAR MEDIUM (MISCELLANEOUS) IMPLANT
SPECIMEN JAR X LARGE (MISCELLANEOUS) ×6 IMPLANT
SPONGE LAP 18X18 X RAY DECT (DISPOSABLE) ×15 IMPLANT
STAPLER VISISTAT 35W (STAPLE) ×6 IMPLANT
STRIP CLOSURE SKIN 1/2X4 (GAUZE/BANDAGES/DRESSINGS) ×10 IMPLANT
SUT ETHILON 3 0 PS 1 (SUTURE) ×3 IMPLANT
SUT MNCRL AB 3-0 PS2 18 (SUTURE) ×12 IMPLANT
SUT MNCRL AB 4-0 PS2 18 (SUTURE) ×9 IMPLANT
SUT MON AB 5-0 PS2 18 (SUTURE) ×6 IMPLANT
SUT PROLENE 2 0 CT2 30 (SUTURE) ×3 IMPLANT
SUT PROLENE 3 0 PS 1 (SUTURE) ×6 IMPLANT
SUT QUILL PDO 2-0 (SUTURE) ×6 IMPLANT
SYR BULB IRRIGATION 50ML (SYRINGE) ×6 IMPLANT
SYR CONTROL 10ML LL (SYRINGE) ×9 IMPLANT
TOWEL OR 17X24 6PK STRL BLUE (TOWEL DISPOSABLE) ×9 IMPLANT
TOWEL OR NON WOVEN STRL DISP B (DISPOSABLE) ×3 IMPLANT
TRAY DSU PREP LF (CUSTOM PROCEDURE TRAY) ×3 IMPLANT
TRAY FOLEY CATH 16FR SILVER (SET/KITS/TRAYS/PACK) ×3 IMPLANT
TUBE CONNECTING 20'X1/4 (TUBING) ×1
TUBE CONNECTING 20X1/4 (TUBING) ×2 IMPLANT
UNDERPAD 30X30 INCONTINENT (UNDERPADS AND DIAPERS) ×9 IMPLANT
VAC PENCILS W/TUBING CLEAR (MISCELLANEOUS) ×3 IMPLANT
YANKAUER SUCT BULB TIP NO VENT (SUCTIONS) ×3 IMPLANT

## 2014-06-11 NOTE — Anesthesia Preprocedure Evaluation (Signed)
Anesthesia Evaluation  Patient identified by MRN, date of birth, ID band Patient awake    Reviewed: Allergy & Precautions, H&P , NPO status , Patient's Chart, lab work & pertinent test results  Airway Mallampati: II TM Distance: >3 FB Neck ROM: Full    Dental no notable dental hx. (+) Teeth Intact, Dental Advisory Given   Pulmonary neg pulmonary ROS,  breath sounds clear to auscultation  Pulmonary exam normal       Cardiovascular negative cardio ROS  Rhythm:Regular Rate:Normal     Neuro/Psych  Headaches, negative psych ROS   GI/Hepatic negative GI ROS, Neg liver ROS,   Endo/Other  Morbid obesity  Renal/GU negative Renal ROS  negative genitourinary   Musculoskeletal   Abdominal   Peds  Hematology negative hematology ROS (+)   Anesthesia Other Findings   Reproductive/Obstetrics negative OB ROS                           Anesthesia Physical Anesthesia Plan  ASA: II  Anesthesia Plan: General   Post-op Pain Management:    Induction: Intravenous  Airway Management Planned: Oral ETT  Additional Equipment:   Intra-op Plan:   Post-operative Plan: Extubation in OR  Informed Consent: I have reviewed the patients History and Physical, chart, labs and discussed the procedure including the risks, benefits and alternatives for the proposed anesthesia with the patient or authorized representative who has indicated his/her understanding and acceptance.   Dental advisory given  Plan Discussed with: CRNA  Anesthesia Plan Comments:         Anesthesia Quick Evaluation

## 2014-06-11 NOTE — Brief Op Note (Signed)
06/11/2014  12:29 PM  PATIENT:  Lollie Sails Grover-Goins  48 y.o. female  PRE-OPERATIVE DIAGNOSIS:  Bilateral Hypertrophy of breast  POST-OPERATIVE DIAGNOSIS:  bilateral hypertrophy of breast  PROCEDURE:  Procedure(s): BILATERAL BREAST REDUCTION  (Bilateral)  SURGEON:  Surgeon(s) and Role:    * Mary A Contogiannis, MD - Primary  ANESTHESIA:   general  EBL:  Total I/O In: 3400 [I.V.:3400] Out: 650 [Urine:350; Blood:300]  BLOOD ADMINISTERED:none  DRAINS: (56F) Jackson-Pratt drain(s) with closed bulb suction in the Bilateral Breasts   LOCAL MEDICATIONS USED:  1.3% Exparel (total 266 mgs.)  SPECIMEN:  Source of Specimen:  Bilateral breasts  DISPOSITION OF SPECIMEN:  PATHOLOGY  COUNTS:  YES  DICTATION: .Other Dictation: Dictation Number [id number]  PLAN OF CARE: Discharge to home after PACU  PATIENT DISPOSITION:  PACU - hemodynamically stable.   Delay start of Pharmacological VTE agent (>24hrs) due to surgical blood loss or risk of bleeding: not applicable

## 2014-06-11 NOTE — H&P (Signed)
  H&P faxed to surgical center.  -History and Physical Reviewed  -Patient has been re-examined  -No change in the plan of care  CONTOGIANNIS,MARY A    

## 2014-06-11 NOTE — Anesthesia Postprocedure Evaluation (Signed)
  Anesthesia Post-op Note  Patient: Sherri Alvarado  Procedure(s) Performed: Procedure(s): BILATERAL BREAST REDUCTION  (Bilateral)  Patient Location: PACU  Anesthesia Type:General  Level of Consciousness: awake and alert   Airway and Oxygen Therapy: Patient Spontanous Breathing  Post-op Pain: moderate  Post-op Assessment: Post-op Vital signs reviewed, Patient's Cardiovascular Status Stable and Respiratory Function Stable  Post-op Vital Signs: Reviewed  Filed Vitals:   06/11/14 1345  BP: 120/73  Pulse: 89  Temp:   Resp: 16    Complications: No apparent anesthesia complications

## 2014-06-11 NOTE — Transfer of Care (Signed)
Immediate Anesthesia Transfer of Care Note  Patient: Sherri Alvarado  Procedure(s) Performed: Procedure(s): BILATERAL BREAST REDUCTION  (Bilateral)  Patient Location: PACU  Anesthesia Type:General  Level of Consciousness: awake, alert  and oriented  Airway & Oxygen Therapy: Patient Spontanous Breathing and Patient connected to face mask oxygen  Post-op Assessment: Report given to PACU RN and Post -op Vital signs reviewed and stable  Post vital signs: Reviewed and stable  Complications: No apparent anesthesia complications

## 2014-06-11 NOTE — Discharge Instructions (Signed)
Post Anesthesia Home Care Instructions  Activity: Get plenty of rest for the remainder of the day. A responsible adult should stay with you for 24 hours following the procedure.  For the next 24 hours, DO NOT: -Drive a car -Advertising copywriterperate machinery -Drink alcoholic beverages -Take any medication unless instructed by your physician -Make any legal decisions or sign important papers.  Meals: Start with liquid foods such as gelatin or soup. Progress to regular foods as tolerated. Avoid greasy, spicy, heavy foods. If nausea and/or vomiting occur, drink only clear liquids until the nausea and/or vomiting subsides. Call your physician if vomiting continues.  Special Instructions/Symptoms: Your throat may feel dry or sore from the anesthesia or the breathing tube placed in your throat during surgery. If this causes discomfort, gargle with warm salt water. The discomfort should disappear within 24 hours. 1. No lifting greater than 5 lbs with arms for 4 weeks. 2. Empty, strip, record and reactivate JP drains 3 times a day. 3. Percocet 5/325 mg tabs 1-2 tabs po q 4-6 hours prn pain- prescription given in office. 4. Duricef 1 tab po bid- prescription given in office. 5. Sterapred dose pack as directed- prescription given in office. 6. Follow-up appointment Friday in office.    JP Drain [employer]  Bring this sheet to all of your post-operative appointments while you have your drains.  Please measure your drains by CC's or ML's.  Make sure you drain and measure your JP Drains 3 times per day.  At the end of each day, add up totals for the left side and add up totals for the right side.    ( 9 am )     ( 3 pm )        ( 9 pm )                Date L  R  L  R  L  R  Total L/R                                                                                                                                                                                       Information for Discharge  Teaching: EXPAREL (bupivacaine liposome injectable suspension)   Your surgeon gave you EXPAREL(bupivacaine) in your surgical incision to help control your pain after surgery.   EXPAREL is a local anesthetic that provides pain relief by numbing the tissue around the surgical site.  EXPAREL is designed to release pain medication over time and can control pain for up to 72 hours.  Depending on how you respond to EXPAREL, you may require less pain medication during your recovery.  Possible  side effects:  Temporary loss of sensation or ability to move in the area where bupivacaine was injected.  Nausea, vomiting, constipation  Rarely, numbness and tingling in your mouth or lips, lightheadedness, or anxiety may occur.  Call your doctor right away if you think you may be experiencing any of these sensations, or if you have other questions regarding possible side effects.  Follow all other discharge instructions given to you by your surgeon or nurse. Eat a healthy diet and drink plenty of water or other fluids.  If you return to the hospital for any reason within 96 hours following the administration of EXPAREL, please inform your health care providers.

## 2014-06-11 NOTE — Anesthesia Procedure Notes (Signed)
Procedure Name: Intubation Date/Time: 06/11/2014 8:07 AM Performed by: Burna Cash Pre-anesthesia Checklist: Patient identified, Emergency Drugs available, Suction available and Patient being monitored Patient Re-evaluated:Patient Re-evaluated prior to inductionOxygen Delivery Method: Circle System Utilized Preoxygenation: Pre-oxygenation with 100% oxygen Intubation Type: IV induction Ventilation: Mask ventilation without difficulty Laryngoscope Size: Mac and 3 Grade View: Grade I Tube type: Oral Tube size: 7.0 mm Number of attempts: 1 Airway Equipment and Method: stylet and oral airway Placement Confirmation: ETT inserted through vocal cords under direct vision,  positive ETCO2 and breath sounds checked- equal and bilateral Secured at: 20 cm Tube secured with: Tape Dental Injury: Teeth and Oropharynx as per pre-operative assessment

## 2014-06-12 ENCOUNTER — Encounter (HOSPITAL_BASED_OUTPATIENT_CLINIC_OR_DEPARTMENT_OTHER): Payer: Self-pay | Admitting: Plastic Surgery

## 2014-07-09 ENCOUNTER — Telehealth: Payer: Self-pay | Admitting: Neurology

## 2014-07-09 MED ORDER — TOPIRAMATE 25 MG PO TABS: 25.0000 mg | ORAL_TABLET | Freq: Two times a day (BID) | ORAL | Status: DC

## 2014-07-09 NOTE — Telephone Encounter (Signed)
Rx has been sent  

## 2014-07-09 NOTE — Telephone Encounter (Signed)
Patient requesting refill of Topamax to last her until her upcoming appointment on 11/18, patient uses [facility] on [address] in [place].

## 2014-07-11 NOTE — Op Note (Signed)
NAME:  Sherri Alvarado, Sherri Alvarado NO.:  [id number]  MEDICAL RECORD NO.:  [id number]  LOCATION:                                FACILITY:  [facility]  PHYSICIAN:  Brantley Persons, M.D.DATE OF BIRTH:  17-Dec-1965  DATE OF PROCEDURE:  06/11/2014 DATE OF DISCHARGE:  06/11/2014                              OPERATIVE REPORT   PREOPERATIVE DIAGNOSIS:  Bilateral macromastia.  POSTOPERATIVE DIAGNOSIS:  Bilateral macromastia.  PROCEDURE:  Bilateral reduction mammoplasties.  ATTENDING SURGEON:  Dr. Corrie Dandy Dmitriy Gair.  ANESTHESIA:  General.  ANESTHESIOLOGIST:  Zenon Mayo, MD  FLUID REPLACEMENT:  3400 mL of crystalloid.  URINE OUTPUT:  350 mL.  ESTIMATED BLOOD LOSS:  300 mL.  COMPLICATIONS:  None.  INDICATIONS FOR THE PROCEDURE:  The patient is a 48 year old, African American female who has bilateral macromastia that is clinically symptomatic.  She presents to undergo bilateral reduction mammoplasties.  PROCEDURE:  The patient was brought to the preop holding area and the breasts were marked in a pattern of Wise for the future bilateral reduction mammoplasties. She was then taken back to the OR, placed on the table in supine position.  After adequate general anesthesia was obtained, the patient's chest was prepped with Techni-Care and draped in sterile fashion.  The bases of the  breasts had been infiltrated 1% lidocaine with epinephrine. After adequate hemostasis and anesthesia taken effect, the procedure was begun.  Both of the breast reductions were performed in the following similar manner.  The nipple-areolar complex was marked with a 45 mm nipple marker.  The skin was then incised and deepithelialized around the nipple-areolar complex down to the inframammary crease in the inferior pedicle pattern.  Next the medial, superior, and lateral skin flaps were elevated down to the chest wall.  The excess fat and glandular tissue were removed from the inferior pedicle.   The nipple-areolar complex was examined found to be pink and viable.  The wound was irrigated with saline irrigation.  Meticulous hemostasis was obtained with Bovie electrocautery. The inferior pedicle was centralized using 3-0 Prolene suture. A #10 JP flat fully fluted drain was placed into the wound.  The skin flaps brought together at the inverted T junction with a 2-0 Prolene suture.  The incisions were stapled for temporary closure.  The breasts compared and found to have good shape and symmetry.  The incisions were then closed from the medial aspect of the JP drain to the medial aspect of the [facility] incision by first placing a few 3-0 Monocryl sutures in the dermal layer and then both the dermal and cuticular layer were closed in a single layer using a 2-0 Quill PDO barbed suture.  Lateral to the JP drain, incision was closed using 3-0 Monocryl in the dermal layer, followed by 3-0 Monocryl running intracuticular stitch on the skin.  The vertical limb of the Wise pattern was also closed using 3-0 Monocryl in the dermal layer.  The patient was placed in the upright position.  The future location of the nipple-areolar complexes was marked on both breast mounds using the 45 mm nipple marker.  She was then placed back in the recumbent position.  Both of the nipple-areolar complexes were brought out onto the breast mounds  in the following similar manner.  The nipple-areolar complex was incised as marked on the breast mound and carried down into the subcutaneous tissues.  The nipple-areolar complex was then examined, found to be pink and viable, then brought out through this aperture and sewn in place using 4-0 Monocryl in the dermal layer, followed by 5-0 Monocryl running intracuticular stitch on the skin.  The vertical limb of the Wise pattern was then closed it in the cuticular layer using 5-0 Monocryl suture.  The JP drain was sewn in place using 3-0 nylon suture. Prior to closing the  incisions, the pectoralis major muscle and fascia along with the breast and chest wall soft tissue had been infiltrated with 1.3% Exparel and now on the inframammary crease incision soft tissue was also infiltrated with the Exparel for a total of 266 mg used. The incisions were dressed with benzoin and Steri- Strips, and the nipples additionally with bacitracin ointment and Adaptic. 4x4s were placed over the incisions and ABD pads in the axillary areas. There were no complications.  The patient tolerated the procedure well. The final needle and sponge counts reportedly correct at the end the case.  The patient was then extubated and taken to recovery room in stable condition.  She was also recovered without complications.  Both the patient and her family were given proper postoperative wound care instructions including care of the JP drains.  She was then discharged home in the care of her family in stable condition.  Followup appointment will be within a few days in the office.          ______________________________ Brantley PersonsMary Kineta Fudala, M.D.     MC/MEDQ  D:  07/11/2014  T:  07/11/2014  Job:  [id number]

## 2014-07-31 ENCOUNTER — Ambulatory Visit (INDEPENDENT_AMBULATORY_CARE_PROVIDER_SITE_OTHER): Payer: 59 | Admitting: Neurology

## 2014-07-31 ENCOUNTER — Encounter: Payer: Self-pay | Admitting: Neurology

## 2014-07-31 VITALS — BP 104/80 | HR 80 | Resp 14 | Ht 65.5 in | Wt 240.0 lb

## 2014-07-31 DIAGNOSIS — R0683 Snoring: Secondary | ICD-10-CM

## 2014-07-31 DIAGNOSIS — Z9889 Other specified postprocedural states: Secondary | ICD-10-CM

## 2014-07-31 DIAGNOSIS — G932 Benign intracranial hypertension: Secondary | ICD-10-CM

## 2014-07-31 HISTORY — DX: Other specified postprocedural states: Z98.890

## 2014-07-31 MED ORDER — TOPIRAMATE 25 MG PO TABS: ORAL_TABLET | ORAL | Status: DC

## 2014-07-31 NOTE — Patient Instructions (Signed)
   yearly revisit with ophthamology.  Prn revisit with neurology if headaches return or if eye sight changed .   Idiopathic Intracranial Hypertension Idiopathic intracranial hypertension (IIH) is a neurologic disorder that leads to increased pressure around your brain. It can cause vision loss and blindness if left untreated. RISK FACTORS IIH is most common in very overweight (obese) women of childbearing age. SIGNS AND SYMPTOMS  Symptoms of IIH include:  Headache.  Feeling of sickness in your stomach (nausea).  Vomiting.  A "rushing of water" sound within your ears (pulsatile tinnitus).  Double vision. DIAGNOSIS  Idiopathic intracranial hypertension is diagnosed with the aid of different exams:  Brain scans such as:  CT.  MRI.  MRV.  Diagnostic lumbar puncture. This procedure can determine if there is too much spinal fluid within the central nervous system. Too much spinal fluid can increase intracranial pressure.  A thorough eye exam will be done to look for swelling within the eyes. Visual field testing will also be done to see if any damage has occurred to nerves in the eyes. TREATMENT  Treatment of idiopathic intracranial hypertension is based on symptoms. Common treatments include:  Lumbar puncture to remove excess spinal fluid.  Medicine.  Surgery. HOME CARE INSTRUCTIONS The most important thing anyone can do to improve this condition is lose weight if they are overweight.  SEEK MEDICAL CARE IF:  You have changes in vision.  You have double vision.  You have loss of color vision. SEEK IMMEDIATE MEDICAL CARE IF:   Your headaches get worse rather than better.  Nausea or vomiting or both continue after treatment.  Your vision does not improve or gets worse after treatment. MAKE SURE YOU:  Understand these instructions.  Will watch your condition.  Will get help right away if you are not doing well or get worse. Document Released: 11/08/2001  Document Revised: 09/04/2013 Document Reviewed: 05/07/2013 [facility] Patient Information 2015 [place], [place]. This information is not intended to replace advice given to you by your health care provider. Make sure you discuss any questions you have with your health care provider.

## 2014-07-31 NOTE — Progress Notes (Addendum)
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 She is overall doing well. Getting good benefit from Topamax, headaches have improved and vision is stable. Continues to follow up with her eye doctor for monitoring. She is dieting and exercising and has been losing weight. Her long term goal is to be able to taper off of Topamax. Meralgia paresthetica symptoms have improved, she will taper off gabapentin. She is scheduled to follow up with plastic surgery for possible breast reduction surgery to help improve her neck/back pain. Follow up in 4 months.  Sherri ChoPeter Sumner, DO   HPI:  Sherri Alvarado is a 48 y.o. female here as a follow up from Dr. Willaim BanePark for headache and paresthesias. Since last visit she has had a LP done which showed opening pressure of 24 cm water . She was started on Topamax 25mg  BID with good improvement in her symptoms.  She was also started on gabapentin for suspected meralgia paresthetica.  Overall states she is doing well since last visit with Dr Hosie PoissonSumner in September, remains obese, and has recently separated form her husband. States that her headaches and vision have improved , but has still a floater in the left eye. Marland Kitchen. Has been evaluated by eye doctor, told that her VF are normal and overall it looks good. Tolerating the topamax well, no adverse effects noted, has temporarily discontinued the TPM and had not seen a relapse. She started however back at 25 mg bid after reading it could cause relapses.   She states her goal is to eventually get off the Topamax.  She is working on dieting, losing weight and wants to use a more natural approach.   She underwent breast reduction surgery to help relieve some of the strain on her back and neck.   Initial visit 12/2013: Patient reports history  of sensory changes, pain, headaches, change in peripheral vision. Had MRI brain done which showed "several small scattered areas of increased flare signal w/in the white matter, not consistent with MS, although cannot be excluded". Since March 6 has noticed some numbness and aching in her left leg. Feels symptoms are getting worse. Notes symptoms are just located in the area of the lateral femoral cutaneous nerve on the left. Denies any radiating symptoms distally. No weakness. No symptoms in RLE or bilateral UE. Notes in the past year has gained around 15 pounds.  She reports her mother has MS. Notes increased stress/anxiety in her life. Lost both her mother and sister in the past year, currently undergoing a divorce. Has a stressful job.  Has been out of work recently and feels that has helped her symptoms.  Notes her headaches have increased, typically occuring 3-4 times a week. Notes as a pressure type pain in her occipital region, can last few minutes up to an hour or so. No nausea or emesis. Mild phonophobia. Recently saw an eye doctor for floaters who told her she has MS, unclear based on what findings, there was some question of decreased VF in left inferior quadrant. Notes her left eye is tender to touch.   Review of Systems: Out of a complete 14 system review, the patient complains of only the following symptoms, and all other reviewed systems are negative.  floaters, less HA< + fatigue, memory loss- word finding, numbness,  aching muscles. Works 9 to [employer], has neologisms.   History   Social History  . Marital Status: Legally Separated    Spouse Name: N/A    Number of Children: 2  . Years of Education: 15   Occupational History  .     Social History Main Topics  . Smoking status: Never Smoker   . Smokeless tobacco: Never Used  . Alcohol Use: 0.0 oz/week    0 Not specified per week     Comment: occasionally  . Drug Use: No     Comment: last used 04/2014  . Sexual Activity: Not on file    Other Topics Concern  . Not on file   Social History Narrative   Separated, 2 children   Right handed   3 yrs of college   Patient drinks very little caffeine.    Family History  Problem Relation Age of Onset  . Multiple sclerosis Mother   . Heart murmur Mother   . Hypertension Mother   . Kidney disease Father   . Cancer - Other Father     throat    Past Medical History  Diagnosis Date  . Idiopathic intracranial hypertension   . Breast hypertrophy 05/2014  . S/P tooth extraction 06/04/2014  . Dental crowns present   . Hx of bilateral breast reduction surgery 07/31/2014    Past Surgical History  Procedure Laterality Date  . Cesarean section      x 2  . Breast reduction surgery Bilateral 06/11/2014    Procedure: BILATERAL BREAST REDUCTION ;  Surgeon: Karie Fetch, MD;  Location: [facility];  Service: Plastics;  Laterality: Bilateral;    Current Outpatient Prescriptions  Medication Sig Dispense Refill  . ALPRAZolam (XANAX) 1 MG tablet     . ibuprofen (ADVIL,MOTRIN) 800 MG tablet Take 800 mg by mouth every 6 (six) hours as needed.    . Multiple Vitamin (MULTIVITAMIN) tablet Take 1 tablet by mouth daily.    Marland Kitchen topiramate (TOPAMAX) 25 MG tablet Take 1 tablet (25 mg total) by mouth 2 (two) times daily. 60 tablet 0  . TRINESSA, 28, 0.18/0.215/0.25 MG-35 MCG tablet     . gabapentin (NEURONTIN) 100 MG capsule Take 1 capsule (100 mg total) by mouth 3 (three) times daily. 270 capsule 0   No current facility-administered medications for this visit.    Allergies as of 07/31/2014 - Review Complete 07/31/2014  Allergen Reaction Noted  . Flour Shortness Of Breath 06/06/2014  . Banana Other (See Comments) 06/06/2014  . Codeine Itching 07/31/2014    Vitals: BP 104/80 mmHg  Pulse 80  Resp 14  Ht 5' 5.5" (1.664 m)  Wt 240 lb (108.863 kg)  BMI 39.32 kg/m2 Last Weight:  Wt Readings from Last 1 Encounters:  07/31/14 240 lb (108.863 kg)   Last Height:     Ht Readings from Last 1 Encounters:  07/31/14 5' 5.5" (1.664 m)     Physical exam: Exam: Gen: NAD, conversant Eyes: anicteric sclerae, moist conjunctivae HENT: Atraumatic, oropharynx clear Neck: Trachea midline; supple,  Lungs: CTA, no wheezing, rales, rhonic                          CV: RRR, no MRG Abdomen: Soft, non-tender;  Extremities: No peripheral edema  Skin: Normal temperature, no rash,  Psych: Appropriate affect, pleasant  Neuro: MS: AA&Ox3, appropriately interactive, normal affect   Speech: fluent, no dysarthria. Low volume.   Memory: has  a history of cannabis abuse, has abulia.    CN: PERRL, unable to visualize optic discs due to pupil size,  Full visual fields.  EOMI no nystagmus, no ptosis, sensation intact to LT V1-V3 bilat, face symmetric,  no weakness, hearing grossly intact, palate elevates symmetrically, shoulder shrug 5/5 bilat,  tongue protrudes midline, no fasiculations noted.  Motor: normal bulk and tone, no cog wheeling, equal , symmetric bulk.  Strength:: 5/5  In all extremities  Coord: rapid alternating and point-to-point (FNF, HTS) movements intact.  Reflexes: symmetrical, bilat downgoing toes  Sens: intact to primary modalities.   Gait: posture, stance, stride and arm-swing normal.  Tandem gait intact. Able to walk on heels and toes. Romberg absent.   Assessment:  After physical and neurologic examination, review of laboratory studies, imaging, neurophysiology testing and pre-existing records, assessment will be reviewed on the problem list.  Plan:  Treatment plan and additional workup will be reviewed under Problem List.  1) pseudotumor cerebri in obese individual.- topiramate helps.  Reduce dose to 50 mg at night only. Borderline glaucoma recently diagnosed.  2) meralgia paresthetica,  Much improved- now off Neurontin.  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 Status breast reduction surgery 06-01-14 with Dr. Fayne Mediate.  .  Much improved neck pain.  Exercises.   Refilled topiramate. PRN revisit with NP  . If memory is still a concern, get MOCA>  The patient will have 90 days supply of topiramate. The patient will have a HST to see if apnea causes some pseudotumor symptoms. g high level of fatigue, FSS 47 ,  EDS - 12 points   Melvyn Novas, MD   [facility] [address] [place], [place] [address]  Phone [phone] Fax [phone]

## 2014-07-31 NOTE — Addendum Note (Signed)
Addended by: Melvyn Novas on: 07/31/2014 10:30 AM   Modules accepted: Orders

## 2014-07-31 NOTE — Sleep Study (Signed)
Please see the scanned sleep study interpretation located in the Procedure tab within the Chart Review section. 

## 2014-08-14 ENCOUNTER — Encounter: Payer: Self-pay | Admitting: Neurology

## 2014-08-14 ENCOUNTER — Telehealth: Payer: Self-pay | Admitting: *Deleted

## 2014-08-14 NOTE — Telephone Encounter (Signed)
Patient was contacted and provided the results of her overnight Home Sleep Study that revealed no significant sleep apnea.  The patient gave verbal permission to mail a copy of her test results.  A copy of the report was faxed to Dr. Willaim BanePark.  Patient informed to contact our office if she would like to have a follow up visit with Dr. Vickey Hugerohmeier.

## 2014-09-04 ENCOUNTER — Ambulatory Visit: Payer: 59 | Admitting: Neurology

## 2014-09-20 ENCOUNTER — Other Ambulatory Visit: Payer: Self-pay | Admitting: Neurology

## 2014-09-20 ENCOUNTER — Telehealth: Payer: Self-pay | Admitting: Neurology

## 2014-09-20 DIAGNOSIS — Z9889 Other specified postprocedural states: Secondary | ICD-10-CM

## 2014-09-20 DIAGNOSIS — G932 Benign intracranial hypertension: Secondary | ICD-10-CM

## 2014-09-20 MED ORDER — TOPIRAMATE 25 MG PO TABS: 50.0000 mg | ORAL_TABLET | Freq: Every day | ORAL | Status: DC

## 2014-09-20 NOTE — Telephone Encounter (Signed)
The patient called for a refill on the Topamax. I called in a refill.

## 2014-10-08 ENCOUNTER — Other Ambulatory Visit: Payer: Self-pay

## 2014-10-08 DIAGNOSIS — Z9889 Other specified postprocedural states: Secondary | ICD-10-CM

## 2014-10-08 DIAGNOSIS — G932 Benign intracranial hypertension: Secondary | ICD-10-CM

## 2014-10-08 MED ORDER — TOPIRAMATE 25 MG PO TABS: 50.0000 mg | ORAL_TABLET | Freq: Every day | ORAL | Status: DC

## 2014-10-14 ENCOUNTER — Telehealth: Payer: Self-pay | Admitting: Neurology

## 2014-10-14 NOTE — Telephone Encounter (Signed)
I called the number provided.  It was for [facility].  Rx was alreasy forwarded to them on 01/26.  I called the patient back, got no answer.  Left message.

## 2014-10-14 NOTE — Telephone Encounter (Signed)
Patient is calling about Rx Topiramate 25 mg.   Insurance requires patient to use [facility] [phone] after 3 prescriptions  Please call

## 2014-11-26 ENCOUNTER — Encounter: Payer: Self-pay | Admitting: Neurology

## 2014-11-26 ENCOUNTER — Ambulatory Visit (INDEPENDENT_AMBULATORY_CARE_PROVIDER_SITE_OTHER): Payer: 59 | Admitting: Neurology

## 2014-11-26 VITALS — BP 116/76 | HR 87 | Resp 12 | Wt 244.8 lb

## 2014-11-26 DIAGNOSIS — G4489 Other headache syndrome: Secondary | ICD-10-CM

## 2014-11-26 DIAGNOSIS — R0683 Snoring: Secondary | ICD-10-CM | POA: Diagnosis not present

## 2014-11-26 DIAGNOSIS — G44209 Tension-type headache, unspecified, not intractable: Secondary | ICD-10-CM | POA: Diagnosis not present

## 2014-11-26 DIAGNOSIS — R51 Headache: Secondary | ICD-10-CM

## 2014-11-26 DIAGNOSIS — F329 Major depressive disorder, single episode, unspecified: Secondary | ICD-10-CM

## 2014-11-26 NOTE — Progress Notes (Signed)
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 She is overall doing well. Getting good benefit from Topamax, headaches have improved and vision is stable. Continues to follow up with her eye doctor for monitoring. She is dieting and exercising and has been losing weight. Her long term goal is to be able to taper off of Topamax. Meralgia paresthetica symptoms have improved, she will taper off gabapentin. She is scheduled to follow up with plastic surgery for possible breast reduction surgery to help improve her neck/back pain. Follow up in 4 months.  Sherri Cho, DO   HPI:  Sherri Alvarado is a 49 y.o. female here as a follow up from Dr. Willaim Bane for headache and paresthesias. She had a LP done which showed opening pressure of 24 cm water . She was started on Topamax  BID with good improvement in her symptoms.  She was also started on gabapentin for suspected meralgia paresthetica.  Overall states she is doing well since last visit with Dr Hosie Poisson in September, remains obese, and has recently separated form her husband. States that her headaches and vision have improved , but has still a floater in the left eye. Marland Kitchen Has been evaluated by eye doctor, told that her VF are normal and overall it looks good. Tolerating the topamax well, no adverse effects noted, has temporarily discontinued the TPM and had not seen a relapse. She started however back at 25 mg bid after reading it could cause relapses. She states her goal is to eventually get off the Topamax. She is working on dieting, losing weight and wants to use a more natural approach.She underwent breast reduction surgery to help relieve some of the strain on her back and neck. Patient reports history of sensory changes, pain, headaches, change in peripheral  vision. Had MRI brain done which showed "several small scattered areas of increased flare signal w/in the white matter, not consistent with MS, although cannot be excluded". Since March 6 has noticed some numbness and aching in her left leg. Feels symptoms are getting worse. Notes symptoms are just located in the area of the lateral femoral cutaneous nerve on the left. Denies any radiating symptoms distally. No weakness. No symptoms in RLE or bilateral UE. Notes in the past year has gained around 15 pounds.  She reports her mother has MS. Notes increased stress/anxiety in her life. Lost both her mother and sister in the past year, currently undergoing a divorce. Has a stressful job.  Has been out of work recently and feels that has helped her symptoms.  Notes her headaches have increased, typically occuring 3-4 times a week. Notes as a pressure type pain in her occipital region, can last few minutes up to an hour or so. No nausea or emesis. Mild phonophobia. Recently saw an eye doctor for floaters who told her she has MS, unclear based on what findings, there was some question of decreased VF in left inferior quadrant. Notes her left eye is tender to touch.    Interval history from 11-26-14.  CD. This former Dr. Hosie Poisson patient underwent a home sleep test on 08-03-14. Her home sleep test did not document any clinically significant apnea the AHI was 3.2 her heart rate was irregular rhythm she had only one brief desaturation of oxygen that I would  also consider clinically insignificant. Moderate snoring was noted but apnea was not seen. I do not believe that the brief hypoxemia would be contributing to the patient's headache symptom. In addition,Mrs.[name] was able to lose some weight but as so many of [place] gained some back over the holidays.  She started  Probiotics. She still has some neck pain and she has brought her FMLA papers with her today to be filled out.    Review of Systems: Out of a  complete 14 system review, the patient complains of only the following symptoms, and all other reviewed systems are negative.  floaters, less HA< + fatigue, memory loss- word finding, numbness, aching muscles. Works from 9.30 to 8 Pm 10 hour shifts. ,  neologisms.   History   Social History  . Marital Status: Legally Separated    Spouse Name: N/A  . Number of Children: 2  . Years of Education: 15   Occupational History  .     Social History Main Topics  . Smoking status: Never Smoker   . Smokeless tobacco: Never Used  . Alcohol Use: 0.0 oz/week    0 Standard drinks or equivalent per week     Comment: occasionally  . Drug Use: No     Comment: last used 04/2014  . Sexual Activity: Not on file   Other Topics Concern  . Not on file   Social History Narrative   Separated, 2 children   Right handed   3 yrs of college   Patient drinks very little caffeine.    Family History  Problem Relation Age of Onset  . Multiple sclerosis Mother   . Heart murmur Mother   . Hypertension Mother   . Kidney disease Father   . Cancer - Other Father     throat    Past Medical History  Diagnosis Date  . Idiopathic intracranial hypertension   . Breast hypertrophy 05/2014  . S/P tooth extraction 06/04/2014  . Dental crowns present   . Hx of bilateral breast reduction surgery 07/31/2014    Past Surgical History  Procedure Laterality Date  . Cesarean section      x 2  . Breast reduction surgery Bilateral 06/11/2014    Procedure: BILATERAL BREAST REDUCTION ;  Surgeon: Karie Fetch, MD;  Location: [facility];  Service: Plastics;  Laterality: Bilateral;    Current Outpatient Prescriptions  Medication Sig Dispense Refill  . ALPRAZolam (XANAX) 1 MG tablet     . topiramate (TOPAMAX) 25 MG tablet Take 2 tablets (50 mg total) by mouth at bedtime. 180 tablet 1  . TRINESSA, 28, 0.18/0.215/0.25 MG-35 MCG tablet     . UNABLE TO FIND Med Name: probiotic tab once po bid.Legrand Rams     . Multiple Vitamin (MULTIVITAMIN) tablet Take 1 tablet by mouth daily.     No current facility-administered medications for this visit.    Allergies as of 11/26/2014 - Review Complete 11/26/2014  Allergen Reaction Noted  . Flour Shortness Of Breath 06/06/2014  . Banana Other (See Comments) 06/06/2014  . Codeine Itching 07/31/2014    Vitals: BP 116/76 mmHg  Pulse 87  Resp 12  Wt 244 lb 12.8 oz (111.041 kg) Last Weight:  Wt Readings from Last 1 Encounters:  11/26/14 244 lb 12.8 oz (111.041 kg)   Last Height:   Ht Readings from Last 1 Encounters:  07/31/14 5' 5.5" (1.664 m)     Physical exam: Exam: Gen: NAD, conversant Eyes:  anicteric sclerae, moist conjunctivae HENT: Atraumatic, oropharynx clear Neck: Trachea midline; supple,  Lungs: CTA, no wheezing, rales, rhonic                          CV: RRR, no MRG Abdomen: Soft, non-tender;  Extremities: No peripheral edema  Skin: Normal temperature, no rash,  Psych: Appropriate affect, pleasant  Neuro: MS: AA&Ox3, appropriately interactive, normal affect  Speech: fluent, no dysarthria. Low volume.  Memory: intact .   CN: PERRL, unable to visualize optic discs due to pupil size,  Full visual fields.  EOMI no nystagmus, no ptosis, sensation intact to LT V1-V3 bilat, face symmetric,  no weakness, hearing grossly intact, palate elevates symmetrically, shoulder shrug 5/5 bilat,  tongue protrudes midline, no fasiculations noted.  Motor: normal bulk and tone, no cog wheeling, equal , symmetric bulk.  Strength:: 5/5  In all extremities Coord: rapid alternating and point-to-point (FNF, HTS) movements intact. Reflexes: symmetrical, bilat downgoing toes Sens: intact to primary modalities.  Gait: posture, stance, stride and arm-swing normal.  Tandem gait intact. Able to walk on heels and toes. Romberg absent. Assessment:  After physical and neurologic examination, review of laboratory studies, imaging, neurophysiology testing  and pre-existing records, assessment :   social stressors: patient works form home, has some underlying depression, is separated form her husband, single mother.    Plan:  Treatment plan and additional workup will be reviewed under Problem List.  1) pseudotumor cerebri in obese individual.- topiramate helps.  Reduce dose to 50 mg at night only. Borderline glaucoma recently diagnosed.  2) meralgia paresthetica,  Much improved- now off Neurontin.  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 Status breast reduction surgery 06-01-14 with Dr. Fayne Mediate.  . Much improved neck pain.  Exercises. Neck pain was better with weight loss, and started again with the weight gain.   Refilled topiramate.  I Filled FMLA., 10 hours for 3 times monthly leave. Beginning  12-18-14   PRN revisit with NP  . If memory is still a concern, get MOCA.  The patient will have 90 days supply of topiramate. Improved fatigue and EDS- , FSS 14 from  47 ,  EDS - 7 from  12 points   Melvyn Novas, MD   [facility] [address] [place], [place] [address]  Phone [phone] Fax [phone]

## 2014-11-27 ENCOUNTER — Telehealth: Payer: Self-pay | Admitting: *Deleted

## 2014-11-27 DIAGNOSIS — Z0289 Encounter for other administrative examinations: Secondary | ICD-10-CM

## 2014-11-27 NOTE — Telephone Encounter (Signed)
Form,Fmla [employer] sent to [facility] and Dr Vickey Huger 11/27/14.

## 2014-11-28 NOTE — Telephone Encounter (Signed)
Patient wants to discuss FMLA forms that were received. Please call.

## 2014-11-28 NOTE — Telephone Encounter (Signed)
Form,[employer] Fmla received,completed by Dr Vickey Huger and Andrey Campanile faxed 11/28/14.

## 2015-11-12 ENCOUNTER — Encounter: Payer: Self-pay | Admitting: Neurology

## 2015-11-12 ENCOUNTER — Ambulatory Visit (INDEPENDENT_AMBULATORY_CARE_PROVIDER_SITE_OTHER): Payer: 59 | Admitting: Neurology

## 2015-11-12 VITALS — BP 126/88 | HR 96 | Resp 20 | Ht 65.0 in | Wt 235.0 lb

## 2015-11-12 DIAGNOSIS — G932 Benign intracranial hypertension: Secondary | ICD-10-CM | POA: Diagnosis not present

## 2015-11-12 DIAGNOSIS — Z9889 Other specified postprocedural states: Secondary | ICD-10-CM

## 2015-11-12 MED ORDER — TOPIRAMATE 25 MG PO TABS: 50.0000 mg | ORAL_TABLET | Freq: Every day | ORAL | Status: DC

## 2015-11-12 NOTE — Progress Notes (Signed)
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 She is overall doing well. Getting good benefit from Topamax, headaches have improved and vision is stable. Continues to follow up with her eye doctor for monitoring. She is dieting and exercising and has been losing weight. Her long term goal is to be able to taper off of Topamax. Meralgia paresthetica symptoms have improved, she will taper off gabapentin. She is scheduled to follow up with plastic surgery for possible breast reduction surgery to help improve her neck/back pain. Follow up in 4 months.  [name], DO   HPI:  Sherri Alvarado is a 50 y.o. female here as a follow up from Dr. Willaim Bane for headache and paresthesias. She had a LP done which showed opening pressure of 24 cm water . She was started on Topamax 25mg  BID with good improvement in her symptoms. She was also started on gabapentin for suspected meralgia paresthetica.  Overall states she is doing well since last visit with Dr Hosie Poisson in September, remains obese, and has recently separated form her husband. States that her headaches and vision have improved , but has still a floater in the left eye. Marland Kitchen Has been evaluated by eye doctor, told that her VF are normal and overall it looks good. Tolerating the topamax well, no adverse effects noted, has temporarily discontinued the TPM and had not seen a relapse. She started however back at 25 mg bid after reading it could cause relapses. She states her goal is to eventually get off the Topamax. She is working on dieting, losing weight and wants to use a more natural approach.She underwent breast reduction surgery to help relieve some of the strain on her back and neck. Patient reports history of sensory changes, pain, headaches, change in  peripheral vision. Had MRI brain done which showed "several small scattered areas of increased flare signal w/in the white matter, not consistent with MS, although cannot be excluded". Since March 6 has noticed some numbness and aching in her left leg. Feels symptoms are getting worse. Notes symptoms are just located in the area of the lateral femoral cutaneous nerve on the left. Denies any radiating symptoms distally. No weakness. No symptoms in RLE or bilateral UE. Notes in the past year has gained around 15 pounds.  She reports her mother has MS. Notes increased stress/anxiety in her life. Lost both her mother and sister in the past year, currently undergoing a divorce. Has a stressful job.  Has been out of work recently and feels that has helped her symptoms.  Notes her headaches have increased, typically occuring 3-4 times a week. Notes as a pressure type pain in her occipital region, can last few minutes up to an hour or so. No nausea or emesis. Mild phonophobia. Recently saw an eye doctor for floaters who told her she has MS, unclear based on what findings, there was some question of decreased VF in left inferior quadrant. Notes her left eye is tender to touch.   Interval history from 11-26-14.  CD. This former Dr. Hosie Poisson patient underwent a home sleep test on 08-03-14. Her home sleep test did not document any clinically significant apnea the AHI was 3.2 her heart rate was irregular rhythm she had only one brief desaturation of oxygen that I would also consider  clinically insignificant. Moderate snoring was noted but apnea was not seen. I do not believe that the brief hypoxemia would be contributing to the patient's headache symptom. In addition,Mrs.Mrs. Kaziah Krizek was able to lose some weight but as so many of [place] gained some back over the holidays.  She started  Probiotics. She still has some neck pain and she has brought her FMLA papers with her today to be filled out.  The patient returns today on  11/12/2015 for a revisit related to her FMLA request for 10 hours per month, she reports that topiramate has reduced her headache frequency and she has one every couple of weeks and usually only in the morning hours but she would like to be able to take a day off work if needed since she works 10 hour shifts. In addition she has discontinued her birth control pills in the meantime which may negatively affect patients with pseudotumor. She feels that this may have another positive effect on her headaches and overall. She was also able to lose about 10 pounds. She had a retinal detachment surgery in the left eye in October 2016. She suffered the retinal detachment 2 days after she closed for a new house moving to [place]. This was not related to pseudotumour according to her ophthalmologist, but due to glaucoma.  No MOCA .     Review of Systems: Out of a complete 14 system review, the patient complains of only the following symptoms, and all other reviewed systems are negative. visiual Floaters, less HA< + fatigue, memory loss- word finding, numbness, aching muscles. Works from 9.30 to 8 Pm 10 hour shifts, neologisms.    Patient now divorced.  Social History   Social History  . Marital Status: Legally Separated    Spouse Name: N/A  . Number of Children: 2  . Years of Education: 15   Occupational History  .     Social History Main Topics  . Smoking status: Never Smoker   . Smokeless tobacco: Never Used  . Alcohol Use: 0.0 oz/week    0 Standard drinks or equivalent per week     Comment: occasionally  . Drug Use: No     Comment: last used 04/2014  . Sexual Activity: Not on file   Other Topics Concern  . Not on file   Social History Narrative   Separated, 2 children   Right handed   3 yrs of college   Patient drinks very little caffeine.    Family History  Problem Relation Age of Onset  . Multiple sclerosis Mother   . Heart murmur Mother   . Hypertension Mother   . Kidney  disease Father   . Cancer - Other Father     throat    Past Medical History  Diagnosis Date  . Idiopathic intracranial hypertension   . Breast hypertrophy 05/2014  . S/P tooth extraction 06/04/2014  . Dental crowns present   . Hx of bilateral breast reduction surgery 07/31/2014    Past Surgical History  Procedure Laterality Date  . Cesarean section      x 2  . Breast reduction surgery Bilateral 06/11/2014    Procedure: BILATERAL BREAST REDUCTION ;  Surgeon: Karie Fetch, MD;  Location: [facility];  Service: Plastics;  Laterality: Bilateral;    Current Outpatient Prescriptions  Medication Sig Dispense Refill  . Multiple Vitamin (MULTIVITAMIN) tablet Take 1 tablet by mouth daily.    Marland Kitchen topiramate (TOPAMAX) 25 MG tablet Take 2  tablets (50 mg total) by mouth at bedtime. 180 tablet 1   No current facility-administered medications for this visit.    Allergies as of 11/12/2015 - Review Complete 11/12/2015  Allergen Reaction Noted  . Flour Shortness Of Breath 06/06/2014  . Banana Other (See Comments) 06/06/2014  . Codeine Itching 07/31/2014    Vitals: BP 126/88 mmHg  Pulse 96  Resp 20  Ht  (1.651 m)  Wt 235 lb (106.595 kg)  BMI 39.11 kg/m2 Last Weight:  Wt Readings from Last 1 Encounters:  11/12/15 235 lb (106.595 kg)   Last Height:   Ht Readings from Last 1 Encounters:  11/12/15  (1.651 m)     Physical exam: Exam: Gen: NAD, conversant Eyes: anicteric sclerae, moist conjunctivae HENT: Atraumatic, oropharynx clear Neck: Trachea midline; supple,  Lungs: CTA, no wheezing, rales, rhonic                          CV: RRR, no MRG Abdomen: Soft, non-tender;  Extremities: No peripheral edema  Skin: Normal temperature, no rash,  Psych: Appropriate affect, concerned,   Neuro: MS: AA&Ox3, appropriately interactive, normal affect  Speech: fluent, no dysarthria. Low volume.  Memory: intact .   CN: PERRL, unable to visualize optic discs  due to pupil size,  Full visual fields.  EOMI no nystagmus, no ptosis, sensation intact to LT V1-V3 bilat, face symmetric,  no weakness, hearing grossly intact, palate elevates symmetrically, shoulder shrug 5/5 bilat,  tongue protrudes midline, no fasiculations noted.  Motor: normal bulk and tone, no cog wheeling, equal , symmetric bulk.  Strength:: 5/5  In all extremities Coord: rapid alternating and point-to-point (FNF, HTS) movements intact. Reflexes: symmetrical, bilat downgoing toes Sens: intact to primary modalities.  Gait: posture, stance, stride and arm-swing normal.  Tandem gait intact. Able to walk on heels and toes. Romberg absent. Assessment:  After physical and neurologic examination, review of laboratory studies, imaging, neurophysiology testing and pre-existing records, assessment :   social stressors: patient works form home, has some underlying depression, is separated form her husband, single mother.    Plan:  Treatment plan and additional workup will be reviewed under Problem List.  1) pseudotumor cerebri in obese individual.- topiramate helps.   Reduce dose to 50 mg at night only. Borderline glaucoma recently diagnosed.  2) meralgia paresthetica,  Much improved- now off Neurontin. Resolved.  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 Status breast reduction surgery 06-01-14 with Dr. Fayne Mediate.. Much improved neck pain.   Able to exercise.  Fast walking, power walking.  Neck pain was better with weight loss, and started again with the weight gain.   Refilled topiramate.  I filled FMLA., 10 hours for 3 times monthly leave.  Was Beginning 12-18-14 - now headaches early morning every couple of weeks, she works from home.  Patient works 10 hours days, request 10 hour FMLA / month 11-05-2015    If memory is still a concern, get MOCA.  The patient will have a 90 days supply of topiramate. Improved fatigue and EDS , FSS down to 14 from 47,  EDS - 7 from 12 points  on [place].    Melvyn Novas, MD   [facility] [address] [place], [place] [address]  Phone [phone] Fax [phone] PRN revisit with NP . No scheduled RV needed

## 2015-11-25 ENCOUNTER — Telehealth: Payer: Self-pay | Admitting: *Deleted

## 2015-11-25 NOTE — Telephone Encounter (Signed)
Patient FMLA form on [employer]

## 2015-11-26 DIAGNOSIS — Z0289 Encounter for other administrative examinations: Secondary | ICD-10-CM

## 2015-11-26 NOTE — Telephone Encounter (Signed)
FMLA form completed, sent to [facility].

## 2015-11-27 ENCOUNTER — Telehealth: Payer: Self-pay | Admitting: *Deleted

## 2015-11-27 NOTE — Telephone Encounter (Signed)
Patient form faxed to [employer], on 11/27/15.

## 2016-02-05 ENCOUNTER — Other Ambulatory Visit (HOSPITAL_COMMUNITY)
Admission: RE | Admit: 2016-02-05 | Discharge: 2016-02-05 | Disposition: A | Discharge status: Home / Self Care | Payer: 59 | Source: Ambulatory Visit | Attending: Family Medicine | Admitting: Family Medicine

## 2016-02-05 ENCOUNTER — Other Ambulatory Visit: Payer: Self-pay | Admitting: Family Medicine

## 2016-02-05 DIAGNOSIS — Z01419 Encounter for gynecological examination (general) (routine) without abnormal findings: Secondary | ICD-10-CM | POA: Insufficient documentation

## 2016-02-10 LAB — CYTOLOGY - PAP

## 2016-05-20 ENCOUNTER — Other Ambulatory Visit: Payer: Self-pay | Admitting: Family Medicine

## 2016-05-20 DIAGNOSIS — Z1231 Encounter for screening mammogram for malignant neoplasm of breast: Secondary | ICD-10-CM

## 2016-06-08 ENCOUNTER — Ambulatory Visit
Admission: RE | Admit: 2016-06-08 | Discharge: 2016-06-08 | Disposition: A | Discharge status: Home / Self Care | Payer: 59 | Source: Ambulatory Visit | Attending: Family Medicine | Admitting: Family Medicine

## 2016-06-08 DIAGNOSIS — Z1231 Encounter for screening mammogram for malignant neoplasm of breast: Secondary | ICD-10-CM

## 2016-11-02 DIAGNOSIS — H40003 Preglaucoma, unspecified, bilateral: Secondary | ICD-10-CM | POA: Diagnosis not present

## 2016-11-10 ENCOUNTER — Ambulatory Visit (INDEPENDENT_AMBULATORY_CARE_PROVIDER_SITE_OTHER): Payer: 59 | Admitting: Adult Health

## 2016-11-10 ENCOUNTER — Encounter: Payer: Self-pay | Admitting: Adult Health

## 2016-11-10 VITALS — BP 106/66 | HR 72 | Ht 65.0 in | Wt 208.2 lb

## 2016-11-10 DIAGNOSIS — G932 Benign intracranial hypertension: Secondary | ICD-10-CM

## 2016-11-10 DIAGNOSIS — G5711 Meralgia paresthetica, right lower limb: Secondary | ICD-10-CM

## 2016-11-10 NOTE — Progress Notes (Signed)
PATIENT: Sherri Alvarado DOB: Apr 01, 1966  REASON FOR VISIT: follow up- pseudotumor cerebri HISTORY FROM: patient  HISTORY OF PRESENT ILLNESS: Mrs. lyndy russman is a 51 year old female with a history of pseudotumor cerebri and meralgia paresthetica. She returns today for follow-up. She states in the last 2-3 months her headache frequency has increased. She states that she has  headache 2-3 times a week. She typically wakes up with the headache. She did switch Topamax to the AM- 25 mg. She states occasionally she will take a second dose but overall she is only been doing 25 mg daily. The patient had a follow-up last week with her ophthalmologist and she was put on eyedrops for glaucoma she reports that her ophthalmologist said that she had no swelling related to pseudotumor cerebri. The patient has also been losing weight. She states that she has lost approximately 20 pounds in the last month and a half. She states that she is trying to lose weight. She is also exercising. She's had a sleep study in the past that was unremarkable. She states that in the last several weeks she's also been having numbness again on the right thigh. She denies any pain. She returns today for an evaluation.  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 She is overall doing well. Getting good benefit from Topamax, headaches have improved and vision is stable. Continues to follow up with her eye doctor for monitoring. She is dieting and exercising and has been losing weight. Her long term goal is to be able to taper off of Topamax. Meralgia paresthetica symptoms have improved, she will taper off gabapentin. She is scheduled to follow up with plastic surgery for possible breast reduction surgery to help improve her neck/back pain. Follow up in 4 months.  [name], DO   HPI:  YANEL DOMBROSKY is a 51 y.o. female here as a follow up from Dr. Willaim Bane for  headache and paresthesias. She had a LP done which showed opening pressure of 24 cm water . She was started on Topamax 25mg  BID with good improvement in her symptoms. She was also started on gabapentin for suspected meralgia paresthetica.  Overall states she is doing well since last visit with Dr Hosie Poisson in September, remains obese, and has recently separated form her husband. States that her headaches and vision have improved , but has still a floater in the left eye. Marland Kitchen Has been evaluated by eye doctor, told that her VF are normal and overall it looks good. Tolerating the topamax well, no adverse effects noted, has temporarily discontinued the TPM and had not seen a relapse. She started however back at 25 mg bid after reading it could cause relapses. She states her goal is to eventually get off the Topamax. She is working on dieting, losing weight and wants to use a more natural approach.She underwent breast reduction surgery to help relieve some of the strain on her back and neck. Patient reports history of sensory changes, pain, headaches, change in peripheral vision. Had MRI brain done which showed "several small scattered areas of increased flare signal w/in the white matter, not consistent with MS, although cannot be excluded". Since March 6 has noticed some numbness and aching in her left leg. Feels symptoms are getting worse. Notes symptoms are just located in the area of the lateral femoral cutaneous nerve on the left. Denies any radiating symptoms distally. No weakness. No symptoms in RLE or bilateral  UE. Notes in the past year has gained around 15 pounds.  She reports her mother has MS. Notes increased stress/anxiety in her life. Lost both her mother and sister in the past year, currently undergoing a divorce. Has a stressful job.  Has been out of work recently and feels that has helped her symptoms.  Notes her headaches have increased, typically occuring 3-4 times a week. Notes as a pressure type pain  in her occipital region, can last few minutes up to an hour or so. No nausea or emesis. Mild phonophobia. Recently saw an eye doctor for floaters who told her she has MS, unclear based on what findings, there was some question of decreased VF in left inferior quadrant. Notes her left eye is tender to touch.   Interval history from 11-26-14.  CD. This former Dr. Hosie Poisson patient underwent a home sleep test on 08-03-14. Her home sleep test did not document any clinically significant apnea the AHI was 3.2 her heart rate was irregular rhythm she had only one brief desaturation of oxygen that I would also consider clinically insignificant. Moderate snoring was noted but apnea was not seen. I do not believe that the brief hypoxemia would be contributing to the patient's headache symptom. In addition,Mrs.Mrs. Shaniya Tashiro was able to lose some weight but as so many of [place] gained some back over the holidays.  She started  Probiotics. She still has some neck pain and she has brought her FMLA papers with her today to be filled out.  The patient returns today on 11/12/2015 for a revisit related to her FMLA request for 10 hours per month, she reports that topiramate has reduced her headache frequency and she has one every couple of weeks and usually only in the morning hours but she would like to be able to take a day off work if needed since she works 10 hour shifts. In addition she has discontinued her birth control pills in the meantime which may negatively affect patients with pseudotumor. She feels that this may have another positive effect on her headaches and overall. She was also able to lose about 10 pounds. She had a retinal detachment surgery in the left eye in October 2016. She suffered the retinal detachment 2 days after she closed for a new house moving to [place]. This was not related to pseudotumour according to her ophthalmologist, but due to glaucoma.  No MOCA .    REVIEW OF SYSTEMS: Out of a  complete 14 system review of symptoms, the patient complains only of the following symptoms, and all other reviewed systems are negative.  Appetite change, light sensitivity, memory loss, headache, numbness, insomnia, anxiety  ALLERGIES: Allergies  Allergen Reactions  . Flour Shortness Of Breath  . Banana Other (See Comments)    TINGLING OF TONGUE  . Codeine Itching    HOME MEDICATIONS: Outpatient Medications Prior to Visit  Medication Sig Dispense Refill  . Multiple Vitamin (MULTIVITAMIN) tablet Take 1 tablet by mouth daily.    Marland Kitchen topiramate (TOPAMAX) 25 MG tablet Take 2 tablets (50 mg total) by mouth at bedtime. (Patient taking differently: Take 50 mg by mouth daily. ) 180 tablet 3   No facility-administered medications prior to visit.     PAST MEDICAL HISTORY: Past Medical History:  Diagnosis Date  . Breast hypertrophy 05/2014  . Dental crowns present   . Hx of bilateral breast reduction surgery 07/31/2014  . Idiopathic intracranial hypertension   . S/P tooth extraction 06/04/2014  PAST SURGICAL HISTORY: Past Surgical History:  Procedure Laterality Date  . BREAST REDUCTION SURGERY Bilateral 06/11/2014   Procedure: BILATERAL BREAST REDUCTION ;  Surgeon: Karie Fetch, MD;  Location: [facility];  Service: Plastics;  Laterality: Bilateral;  . CESAREAN SECTION     x 2    FAMILY HISTORY: Family History  Problem Relation Age of Onset  . Multiple sclerosis Mother   . Heart murmur Mother   . Hypertension Mother   . Kidney disease Father   . Cancer - Other Father     throat    SOCIAL HISTORY: Social History   Social History  . Marital status: Legally Separated    Spouse name: N/A  . Number of children: 2  . Years of education: 15   Occupational History  .  [employer]   Social History Main Topics  . Smoking status: Never Smoker  . Smokeless tobacco: Never Used  . Alcohol use 0.0 oz/week     Comment: occasionally  . Drug use: No      Comment: last used 04/2014  . Sexual activity: Not on file   Other Topics Concern  . Not on file   Social History Narrative   Separated, 2 children   Right handed   3 yrs of college   Patient drinks very little caffeine.      PHYSICAL EXAM  Vitals:   11/10/16 0935  BP: 106/66  Pulse: 72  Weight: 208 lb 3.2 oz (94.4 kg)  Height: 5\' 5"  (1.651 m)   Body mass index is 34.65 kg/m.  Generalized: Well developed, in no acute distress   Neurological examination  Mentation: Alert oriented to time, place, history taking. Follows all commands speech and language fluent Cranial nerve II-XII: Pupils were equal round reactive to light. Extraocular movements were full, visual field were full on confrontational test. Facial sensation and strength were normal. Uvula tongue midline. Head turning and shoulder shrug  were normal and symmetric. Motor: The motor testing reveals 5 over 5 strength of all 4 extremities. Good symmetric motor tone is noted throughout.  Sensory: Sensory testing is intact to soft touch on all 4 extremities. No evidence of extinction is noted.  Coordination: Cerebellar testing reveals good finger-nose-finger and heel-to-shin bilaterally.  Gait and station: Gait is normal. Tandem gait is normal. Romberg is negative. No drift is seen.  Reflexes: Deep tendon reflexes are symmetric and normal bilaterally.   DIAGNOSTIC DATA (LABS, IMAGING, TESTING) - I reviewed patient records, labs, notes, testing and imaging myself where available.     ASSESSMENT AND PLAN 51 y.o. year old female  has a past medical history of Breast hypertrophy (05/2014); Dental crowns present; bilateral breast reduction surgery (07/31/2014); Idiopathic intracranial hypertension; and S/P tooth extraction (06/04/2014). here with:  1. Pseudotumor cerebri 2. Meralgia paresthetica  The patient is encouraged to take Topamax 25 mg in the morning and in the evening. Advised that if this is not beneficial for  her headaches she should let us know. The patient will continue to monitor numbness in the right thigh. If this becomes persistent or painful we may consider adding gabapentin. The patient does have some anxiety related to her diagnosis. I have advised that she should follow-up with her primary care for anxiety. She voiced understanding. She will follow-up in our office in 3 months or sooner if needed.  Butch Penny, MSN, NP-C 11/10/2016, 9:42 AM [facility] [address], [address] [place], [place] [address] [phone]

## 2016-11-10 NOTE — Patient Instructions (Signed)
Take Topamax 25 mg twice a day Have Opthalmologist send over their report

## 2016-11-10 NOTE — Progress Notes (Signed)
I agree with the assessment and plan as directed by NP .The patient is known to me .   Mckenzee Beem, MD  

## 2016-11-16 ENCOUNTER — Telehealth: Payer: Self-pay

## 2016-11-16 NOTE — Telephone Encounter (Signed)
FMLA form completed and signed by Dr. Dohmeier. Sent to MR for processing. 

## 2016-11-25 DIAGNOSIS — Z0289 Encounter for other administrative examinations: Secondary | ICD-10-CM

## 2017-01-06 ENCOUNTER — Telehealth: Payer: Self-pay | Admitting: *Deleted

## 2017-01-31 DIAGNOSIS — L309 Dermatitis, unspecified: Secondary | ICD-10-CM | POA: Diagnosis not present

## 2017-02-16 ENCOUNTER — Ambulatory Visit: Payer: 59 | Admitting: Adult Health

## 2017-03-22 ENCOUNTER — Encounter: Payer: Self-pay | Admitting: Adult Health

## 2017-03-22 ENCOUNTER — Ambulatory Visit (INDEPENDENT_AMBULATORY_CARE_PROVIDER_SITE_OTHER): Payer: 59 | Admitting: Adult Health

## 2017-03-22 VITALS — BP 99/62 | HR 86 | Wt 202.6 lb

## 2017-03-22 DIAGNOSIS — G932 Benign intracranial hypertension: Secondary | ICD-10-CM

## 2017-03-22 NOTE — Progress Notes (Signed)
I agree with the assessment and plan as directed by NP .The patient is known to me .   Temekia Caskey, MD  

## 2017-03-22 NOTE — Progress Notes (Signed)
PATIENT: Sherri Alvarado DOB: [birth year]  REASON FOR VISIT: follow up- pseudotumor cerebri HISTORY FROM: patient  HISTORY OF PRESENT ILLNESS: Today 03/22/17  Sherri Alvarado is a 51 year old female with a history of pseudotumor cerebri. She returns today for follow-up. She states that she has not been taking Topamax consistently. She states that she typically takes it when the headaches start back up. She states that she tends to go periods with no headaches then headaches may return. She does see her ophthalmologist Thursday. She also notes that she has been under more stress lately and unsure if this has any effect on her headaches. She continues to try to diet and exercise. She denies any changes with her vision. She also reports that her eczema has been flaring up. She returns today for an evaluation.  HISTORY 11/10/16: Sherri Alvarado is a 51 year old female with a history of pseudotumor cerebri and meralgia paresthetica. She returns today for follow-up. She states in the last 2-3 months her headache frequency has increased. She states that she has  headache 2-3 times a week. She typically wakes up with the headache. She did switch Topamax to the AM- 25 mg. She states occasionally she will take a second dose but overall she is only been doing 25 mg daily. The patient had a follow-up last week with her ophthalmologist and she was put on eyedrops for glaucoma she reports that her ophthalmologist said that she had no swelling related to pseudotumor cerebri. The patient has also been losing weight. She states that she has lost approximately 20 pounds in the last month and a half. She states that she is trying to lose weight. She is also exercising. She's had a sleep study in the past that was unremarkable. She states that in the last several weeks she's also been having numbness again on the right thigh. She denies any pain. She returns today for an evaluation.  REVIEW OF SYSTEMS: Out of a complete  14 system review of symptoms, the patient complains only of the following symptoms, and all other reviewed systems are negative.  Environmental allergies, food allergies, memory loss, headache, numbness, nervous/anxious, insomnia, rash, itching  ALLERGIES: Allergies  Allergen Reactions  . Flour Shortness Of Breath  . Banana Other (See Comments)    TINGLING OF TONGUE  . Codeine Itching    HOME MEDICATIONS: Outpatient Medications Prior to Visit  Medication Sig Dispense Refill  . Multiple Vitamin (MULTIVITAMIN) tablet Take 1 tablet by mouth daily.    Marland Kitchen topiramate (TOPAMAX) 25 MG tablet Take 2 tablets (50 mg total) by mouth at bedtime. (Patient taking differently: Take 50 mg by mouth daily. ) 180 tablet 3   No facility-administered medications prior to visit.     PAST MEDICAL HISTORY: Past Medical History:  Diagnosis Date  . Breast hypertrophy 05/2014  . Dental crowns present   . Hx of bilateral breast reduction surgery 07/31/2014  . Idiopathic intracranial hypertension   . S/P tooth extraction 06/04/2014    PAST SURGICAL HISTORY: Past Surgical History:  Procedure Laterality Date  . BREAST REDUCTION SURGERY Bilateral 06/11/2014   Procedure: BILATERAL BREAST REDUCTION ;  Surgeon: Karie Fetch, MD;  Location: [facility];  Service: Plastics;  Laterality: Bilateral;  . CESAREAN SECTION     x 2    FAMILY HISTORY: Family History  Problem Relation Age of Onset  . Multiple sclerosis Mother   . Heart murmur Mother   . Hypertension Mother   . Kidney  disease Father   . Cancer - Other Father        throat    SOCIAL HISTORY: Social History   Social History  . Marital status: Legally Separated    Spouse name: N/A  . Number of children: 2  . Years of education: 15   Occupational History  .  [employer]   Social History Main Topics  . Smoking status: Never Smoker  . Smokeless tobacco: Never Used  . Alcohol use 0.0 oz/week     Comment:  occasionally  . Drug use: No     Comment: last used 04/2014  . Sexual activity: Not on file   Other Topics Concern  . Not on file   Social History Narrative   Separated, 2 children   Right handed   3 yrs of college   Patient drinks very little caffeine.      PHYSICAL EXAM  Vitals:   03/22/17 1430  BP: 99/62  Pulse: 86  Weight: 202 lb 9.6 oz (91.9 kg)   Body mass index is 33.71 kg/m.  Generalized: Well developed, in no acute distress   Neurological examination  Mentation: Alert oriented to time, place, history taking. Follows all commands speech and language fluent Cranial nerve II-XII: Pupils were equal round reactive to light. Extraocular movements were full, visual field were full on confrontational test. Facial sensation and strength were normal. Uvula tongue midline. Head turning and shoulder shrug  were normal and symmetric. Motor: The motor testing reveals 5 over 5 strength of all 4 extremities. Good symmetric motor tone is noted throughout.  Sensory: Sensory testing is intact to soft touch on all 4 extremities. No evidence of extinction is noted.  Coordination: Cerebellar testing reveals good finger-nose-finger and heel-to-shin bilaterally.  Gait and station: Gait is normal. Tandem gait is normal. Romberg is negative. No drift is seen.  Reflexes: Deep tendon reflexes are symmetric and normal bilaterally.   DIAGNOSTIC DATA (LABS, IMAGING, TESTING) - I reviewed patient records, labs, notes, testing and imaging myself where available.  Lab Results  Component Value Date   HGB 11.9 (L) 06/11/2014     ASSESSMENT AND PLAN 51 y.o. year old female  has a past medical history of Breast hypertrophy (05/2014); Dental crowns present; bilateral breast reduction surgery (07/31/2014); Idiopathic intracranial hypertension; and S/P tooth extraction (06/04/2014). here with:  1. Pseudotumor cerebri  Patient is encouraged to take Topamax 25 mg twice a day consistently. I have  asked that she send [place] her ophthalmology report when she has her visit. Patient voices understanding. She is advised that if her headache worsens or she has any visual changes she should let us know. She is advised that if she has any sudden visual loss she should to the emergency room. Patient voices understanding. She will follow-up in 6 months with Dr. Vickey Huger.  I spent 15 minutes with the patient. 50% of this time was spent discussing her diagnosis and treatment      Butch Penny, MSN, NP-C 03/22/2017, 2:34 PM [facility] [address] [place], [place] [address] [phone]

## 2017-03-22 NOTE — Patient Instructions (Addendum)
Your Plan:  Continue Topamax 25 mg twice a day Please fax over opthalmology report If your symptoms worsen or you develop new symptoms please let us know.   Thank you for coming to see [place] at [facility]. I hope we have been able to provide you high quality care today.  You may receive a patient satisfaction survey over the next few weeks. We would appreciate your feedback and comments so that we may continue to improve ourselves and the health of our patients.

## 2017-03-23 ENCOUNTER — Telehealth: Payer: Self-pay | Admitting: Neurology

## 2017-03-23 ENCOUNTER — Telehealth: Payer: Self-pay | Admitting: Adult Health

## 2017-03-23 DIAGNOSIS — Z9889 Other specified postprocedural states: Secondary | ICD-10-CM

## 2017-03-23 DIAGNOSIS — G932 Benign intracranial hypertension: Secondary | ICD-10-CM

## 2017-03-23 MED ORDER — TOPIRAMATE 25 MG PO TABS: 50.0000 mg | ORAL_TABLET | Freq: Every day | ORAL | 3 refills | Status: DC

## 2017-03-23 NOTE — Telephone Encounter (Signed)
Pt calling for a refill of topiramate (TOPAMAX) 25 MG tablet  Pt realizied she did not have as many as she originally thought.  Please fill through  [facility] - [place], [place] - [address] [phone] (Phone) [phone] (Fax)

## 2017-03-23 NOTE — Telephone Encounter (Signed)
This message was created in error

## 2017-03-23 NOTE — Telephone Encounter (Signed)
[facility] as requested by patient.

## 2017-03-23 NOTE — Addendum Note (Signed)
Addended by: Colen Darling C on: 03/23/2017 11:28 AM   Modules accepted: Orders

## 2017-03-24 DIAGNOSIS — H40003 Preglaucoma, unspecified, bilateral: Secondary | ICD-10-CM | POA: Diagnosis not present

## 2017-03-29 DIAGNOSIS — Z13220 Encounter for screening for lipoid disorders: Secondary | ICD-10-CM | POA: Diagnosis not present

## 2017-03-29 DIAGNOSIS — Z0000 Encounter for general adult medical examination without abnormal findings: Secondary | ICD-10-CM | POA: Diagnosis not present

## 2017-05-11 DIAGNOSIS — L309 Dermatitis, unspecified: Secondary | ICD-10-CM | POA: Diagnosis not present

## 2017-05-17 ENCOUNTER — Telehealth: Payer: Self-pay | Admitting: Adult Health

## 2017-05-17 NOTE — Telephone Encounter (Signed)
Patient called office in reference to being seen in July and not having Gabapentin refilled.  Patient states she she has not taken the Gabapentin due to numbness going away, but it is back and would like to see about getting Gabapentin refilled.  Pharmacy- [facility] [address]

## 2017-05-18 MED ORDER — GABAPENTIN 100 MG PO CAPS: 100.0000 mg | ORAL_CAPSULE | Freq: Three times a day (TID) | ORAL | 5 refills | Status: DC

## 2017-05-18 NOTE — Telephone Encounter (Signed)
Gabapentin has been ordered. Please have her follow-up within the next 1-2 months

## 2017-05-18 NOTE — Telephone Encounter (Signed)
Spoke with patient and informed her that Aundra Millet ordered gabapentin for her. Advised she needs to be seen in 1-2 months.  Patient stated her son has just been diagnosed with epilepsy, and she has to get him settled in at school before she can come in. She stated the earliest she could arrive is 8:30 am. Scheduled her for first available on 09/01/17, will notify NP. If this is not acceptable with NP will reschedule. Advised patient to call prior to FU with any concerns, questions, worsening symptoms. Advised her she will only get a call back if her FU needs to be moved sooner per NP.  Patient verbalized understanding, appreciation.

## 2017-05-18 NOTE — Addendum Note (Signed)
Addended by: Enedina Finner on: 05/18/2017 09:35 AM   Modules accepted: Orders

## 2017-05-18 NOTE — Telephone Encounter (Signed)
That is fine 

## 2017-05-25 ENCOUNTER — Ambulatory Visit: Payer: 59 | Admitting: Adult Health

## 2017-06-02 ENCOUNTER — Other Ambulatory Visit: Payer: Self-pay | Admitting: Family Medicine

## 2017-06-02 DIAGNOSIS — Z23 Encounter for immunization: Secondary | ICD-10-CM | POA: Diagnosis not present

## 2017-06-02 DIAGNOSIS — Z1231 Encounter for screening mammogram for malignant neoplasm of breast: Secondary | ICD-10-CM

## 2017-06-08 ENCOUNTER — Ambulatory Visit (INDEPENDENT_AMBULATORY_CARE_PROVIDER_SITE_OTHER): Payer: 59 | Admitting: Adult Health

## 2017-06-08 ENCOUNTER — Ambulatory Visit
Admission: RE | Admit: 2017-06-08 | Discharge: 2017-06-08 | Disposition: A | Discharge status: Home / Self Care | Payer: 59 | Source: Ambulatory Visit | Attending: Family Medicine | Admitting: Family Medicine

## 2017-06-08 ENCOUNTER — Encounter: Payer: Self-pay | Admitting: Adult Health

## 2017-06-08 ENCOUNTER — Other Ambulatory Visit: Payer: Self-pay | Admitting: Family Medicine

## 2017-06-08 VITALS — BP 109/67 | HR 66 | Wt 201.4 lb

## 2017-06-08 DIAGNOSIS — R198 Other specified symptoms and signs involving the digestive system and abdomen: Secondary | ICD-10-CM | POA: Diagnosis not present

## 2017-06-08 DIAGNOSIS — M542 Cervicalgia: Secondary | ICD-10-CM

## 2017-06-08 DIAGNOSIS — G932 Benign intracranial hypertension: Secondary | ICD-10-CM | POA: Diagnosis not present

## 2017-06-08 DIAGNOSIS — M25552 Pain in left hip: Secondary | ICD-10-CM

## 2017-06-08 DIAGNOSIS — M25562 Pain in left knee: Secondary | ICD-10-CM | POA: Diagnosis not present

## 2017-06-08 DIAGNOSIS — M179 Osteoarthritis of knee, unspecified: Secondary | ICD-10-CM | POA: Diagnosis not present

## 2017-06-08 DIAGNOSIS — M1612 Unilateral primary osteoarthritis, left hip: Secondary | ICD-10-CM | POA: Diagnosis not present

## 2017-06-08 DIAGNOSIS — G5711 Meralgia paresthetica, right lower limb: Secondary | ICD-10-CM

## 2017-06-08 MED ORDER — GABAPENTIN 100 MG PO CAPS: 200.0000 mg | ORAL_CAPSULE | Freq: Three times a day (TID) | ORAL | 5 refills | Status: DC

## 2017-06-08 NOTE — Progress Notes (Signed)
PATIENT: Sherri Alvarado DOB: 02/05/1966  REASON FOR VISIT: follow up-pseudotumor cerebri 2. Numbness HISTORY FROM: patient  HISTORY OF PRESENT ILLNESS: Today 06/08/17 Sherri Alvarado is a 51 year old female with a history of pseudotumor cerebri. She returns today for follow-up. At the last visit she was encouraged to take Topamax 25 mg twice a day consistently. She reports that her headaches have improved. She denies any visual changes. She did follow-up with her ophthalmologist and she reports that her exam was normal. She states that she was told she had no papilledema. I have not seen the reports from ophthalmology. She states that she's been having some new issues. She states that she has been having neck pain. She states that typically when she is working on her computer she will get pain in the back of the neck and when she goes to move her neck she'll have some discomfort. She also hears some "cracking." She denies any numbness or tingling down the arms. No pain in the arms. She reports that she also is having some left hip pain. She states that times it feels as if she has a "crick" in her hip. She states that she also has some "cracking" in the knees. She plans to follow-up with her primary care for these things. She states that she also has found that when she exercises that she develops hives all over her chest. She did discuss this with her primary care but decided to make an appointment with an allergist who she will see in October. She reports that she is taking gabapentin 100 mg 3 times a day. As of now she's not really seen a big change. She reports that she still has numbness in the right thigh typically bedtime. She returns today for an evaluation    HISTORY 03/22/17:Today 03/22/17  Sherri Alvarado is a 51 year old female with a history of pseudotumor cerebri. She returns today for follow-up. She states that she has not been taking Topamax consistently. She states that she typically  takes it when the headaches start back up. She states that she tends to go periods with no headaches then headaches may return. She does see her ophthalmologist Thursday. She also notes that she has been under more stress lately and unsure if this has any effect on her headaches. She continues to try to diet and exercise. She denies any changes with her vision. She also reports that her eczema has been flaring up. She returns today for an evaluation.  HISTORY 11/10/16: Sherri Alvarado is a 51 year old female with a history of pseudotumor cerebri and meralgia paresthetica. She returns today for follow-up. She states in the last 2-3 months her headache frequency has increased. She states that she has headache 2-3 times a week. She typically wakes up with the headache. She did switch Topamax to the AM-25 mg.She states occasionally she will take a second dose but overall she is only been doing 25 mg daily. The patient had a follow-up last week with her ophthalmologist and she was put on eyedrops for glaucoma she reports that her ophthalmologist said that she had no swelling related to pseudotumor cerebri. The patient has also been losing weight. She states that she has lost approximately 20 pounds in the last month and a half. She states that she is trying to lose weight. She is also exercising. She's had a sleep study in the past that was unremarkable. She states that in the last several weeks she's also been having numbness again  on the right thigh. She denies any pain. She returns today for an evaluation.    REVIEW OF SYSTEMS: Out of a complete 14 system review of symptoms, the patient complains only of the following symptoms, and all other reviewed systems are negative.  ALLERGIES: Allergies  Allergen Reactions  . Flour Shortness Of Breath  . Banana Other (See Comments)    TINGLING OF TONGUE  . Codeine Itching    HOME MEDICATIONS: Outpatient Medications Prior to Visit  Medication Sig  Dispense Refill  . gabapentin (NEURONTIN) 100 MG capsule Take 1 capsule (100 mg total) by mouth 3 (three) times daily. 90 capsule 5  . Multiple Vitamin (MULTIVITAMIN) tablet Take 1 tablet by mouth daily.    Marland Kitchen topiramate (TOPAMAX) 25 MG tablet Take 2 tablets (50 mg total) by mouth at bedtime. 180 tablet 3   No facility-administered medications prior to visit.     PAST MEDICAL HISTORY: Past Medical History:  Diagnosis Date  . Breast hypertrophy 05/2014  . Dental crowns present   . Hx of bilateral breast reduction surgery 07/31/2014  . Idiopathic intracranial hypertension   . S/P tooth extraction 06/04/2014    PAST SURGICAL HISTORY: Past Surgical History:  Procedure Laterality Date  . BREAST REDUCTION SURGERY Bilateral 06/11/2014   Procedure: BILATERAL BREAST REDUCTION ;  Surgeon: Karie Fetch, MD;  Location: [facility];  Service: Plastics;  Laterality: Bilateral;  . CESAREAN SECTION     x 2    FAMILY HISTORY: Family History  Problem Relation Age of Onset  . Multiple sclerosis Mother   . Heart murmur Mother   . Hypertension Mother   . Kidney disease Father   . Cancer - Other Father        throat    SOCIAL HISTORY: Social History   Social History  . Marital status: Legally Separated    Spouse name: N/A  . Number of children: 2  . Years of education: 15   Occupational History  .  [employer]   Social History Main Topics  . Smoking status: Never Smoker  . Smokeless tobacco: Never Used  . Alcohol use 0.0 oz/week     Comment: occasionally  . Drug use: No     Comment: last used 04/2014  . Sexual activity: Not on file   Other Topics Concern  . Not on file   Social History Narrative   Separated, 2 children   Right handed   3 yrs of college   Patient drinks very little caffeine.      PHYSICAL EXAM  Vitals:   06/08/17 1029  BP: 109/67  Pulse: 66  Weight: 201 lb 6.4 oz (91.4 kg)   Body mass index is 33.51 kg/m.  Generalized:  Well developed, in no acute distress   Neurological examination  Mentation: Alert oriented to time, place, history taking. Follows all commands speech and language fluent Cranial nerve II-XII: Pupils were equal round reactive to light. Extraocular movements were full, visual field were full on confrontational test. Facial sensation and strength were normal. Uvula tongue midline. Head turning and shoulder shrug  were normal and symmetric. Motor: The motor testing reveals 5 over 5 strength of all 4 extremities. Good symmetric motor tone is noted throughout.  Sensory: Sensory testing is intact to soft touch on all 4 extremities. No evidence of extinction is noted.  Coordination: Cerebellar testing reveals good finger-nose-finger and heel-to-shin bilaterally.  Gait and station: Gait is normal. Tandem gait is normal. Romberg is negative.  No drift is seen.  Reflexes: Deep tendon reflexes are symmetric and normal bilaterally.   DIAGNOSTIC DATA (LABS, IMAGING, TESTING) - I reviewed patient records, labs, notes, testing and imaging myself where available.  Lab Results  Component Value Date   HGB 11.9 (L) 06/11/2014      ASSESSMENT AND PLAN 51 y.o. year old female  has a past medical history of Breast hypertrophy (05/2014); Dental crowns present; bilateral breast reduction surgery (07/31/2014); Idiopathic intracranial hypertension; and S/P tooth extraction (06/04/2014). here with:  1. Pseudotumor cerebri 2. Neck pain 3. Meralgia paresthetica  The patient will continue on Topamax 25 mg twice a day. Her headaches have improved since taking this medication she currently does not have any visual disturbances. She is reporting new symptom of neck pain. Physical exam is relatively unremarkable. I will send the patient for physical therapy. I will also increase gabapentin to 200 mg 3 times a day to see if this offers any benefit for her neck pain as well as any discomfort in the right thigh. She is advised  that if her symptoms worsen or she develops new symptoms she should let us know. She will follow-up in 3-4 months with Dr. Vergia Alcon, MSN, NP-C 06/08/2017, 10:22 AM [facility] [address], [address] [place], [place] [address] [phone]

## 2017-06-08 NOTE — Progress Notes (Signed)
I agree with the assessment and plan as directed by NP .The patient is known to me .   Anna-Marie Coller, MD  

## 2017-06-08 NOTE — Patient Instructions (Signed)
Your Plan:  Continue Topamax 25 mg twice a day Increase Gabapentin 200 mg three times a day physical therapy for neck pain If your symptoms worsen or you develop new symptoms please let us know.    Thank you for coming to see [place] at [facility]. I hope we have been able to provide you high quality care today.  You may receive a patient satisfaction survey over the next few weeks. We would appreciate your feedback and comments so that we may continue to improve ourselves and the health of our patients.

## 2017-07-01 DIAGNOSIS — L309 Dermatitis, unspecified: Secondary | ICD-10-CM | POA: Diagnosis not present

## 2017-07-01 DIAGNOSIS — R198 Other specified symptoms and signs involving the digestive system and abdomen: Secondary | ICD-10-CM | POA: Diagnosis not present

## 2017-07-04 ENCOUNTER — Ambulatory Visit: Payer: 59 | Admitting: Physical Therapy

## 2017-07-04 DIAGNOSIS — L2089 Other atopic dermatitis: Secondary | ICD-10-CM | POA: Diagnosis not present

## 2017-07-04 DIAGNOSIS — L308 Other specified dermatitis: Secondary | ICD-10-CM | POA: Diagnosis not present

## 2017-07-05 ENCOUNTER — Ambulatory Visit
Admission: RE | Admit: 2017-07-05 | Discharge: 2017-07-05 | Disposition: A | Discharge status: Home / Self Care | Payer: 59 | Source: Ambulatory Visit | Attending: Family Medicine | Admitting: Family Medicine

## 2017-07-05 DIAGNOSIS — Z1231 Encounter for screening mammogram for malignant neoplasm of breast: Secondary | ICD-10-CM

## 2017-07-06 ENCOUNTER — Ambulatory Visit (INDEPENDENT_AMBULATORY_CARE_PROVIDER_SITE_OTHER): Payer: 59 | Admitting: Allergy

## 2017-07-06 ENCOUNTER — Ambulatory Visit: Payer: 59 | Admitting: Physical Therapy

## 2017-07-06 ENCOUNTER — Other Ambulatory Visit: Payer: Self-pay | Admitting: *Deleted

## 2017-07-06 ENCOUNTER — Encounter: Payer: Self-pay | Admitting: Allergy

## 2017-07-06 VITALS — BP 120/65 | HR 70 | Temp 97.8°F | Resp 16 | Ht 64.17 in | Wt 207.0 lb

## 2017-07-06 DIAGNOSIS — Z91018 Allergy to other foods: Secondary | ICD-10-CM | POA: Diagnosis not present

## 2017-07-06 DIAGNOSIS — L2089 Other atopic dermatitis: Secondary | ICD-10-CM | POA: Diagnosis not present

## 2017-07-06 DIAGNOSIS — L508 Other urticaria: Secondary | ICD-10-CM

## 2017-07-06 MED ORDER — EPINEPHRINE 0.3 MG/0.3ML IJ SOAJ: INTRAMUSCULAR | 1 refills | Status: DC

## 2017-07-06 MED ORDER — CRISABOROLE 2 % EX OINT: 1.0000 "application " | TOPICAL_OINTMENT | Freq: Two times a day (BID) | CUTANEOUS | 5 refills | Status: DC | PRN

## 2017-07-06 NOTE — Patient Instructions (Signed)
Hives, exercise induced    - take long-acting antihistamine like Xyal 5mg  at least 30 minute prior to activities.  May need to take additional dose of Xyzal as needed for control of hives    - don't exercise alone    - have access to epinephrine device (Epipen or AuviQ)-- follow emergency action plan in case of reaction during exercise     Eczema     - take hydroxyzine as directed by dermatologist     - Xyzal as above     - use triamcinolone as directed by dermatologist     - will have you add on Eucrisa to see if this has any added benefit to your triamcinolone     - we have discussed Dupixent as additonal therapy for eczema management including benefits, risks and protocol of administration.  Provided with Dupixent informational brochure     - food allergy testing today is positive to peanuts, tree nuts as well as wheat and beef  Food allergy    - testing today as above.  Recommend avoidance of above foods    - have access to epinephrine device and emergency action plan.   Follow-up 2-3 months or sooner if needed

## 2017-07-06 NOTE — Progress Notes (Signed)
New Patient Note  RE: Sherri StanleyVeronica S Alvarado MRN: [id number] DOB: 06-14-1966 Date of Office Visit: 07/06/2017  Referring provider: Blair HeysEhinger, Robert, MD Primary care provider: Blair HeysEhinger, Robert, MD  Chief Complaint: hives  History of present illness: Sherri Alvarado is a 51 y.o. female presenting today for consultation for hives.      She has a history of pseudotumor cerebri followed by neurology.  She started exercising to help manage this and has lost about 80 pounds.  She was exercising (which typically involves walking on the treadmill and occasionally jogging) about 5 days/week however she stopped exercising around the first week of September as she noticed that she was developing hives with exercise.  Hives started in July 2018.  She starts feeling itchy during exercise and after she is done is when the hives develop.  The hives can be all over and she provided pictures that does show hives pretty diffusely.  She reports normally after she finishes exercising she takes a warm shower which also seems to make the hives worse.  The hives go away in about 2-3 hours afterwards and do not leave any marks or bruising.  She denies any swelling of the hives.  She denies any joint aches or pains or fevers.  She reports [facility] where afterwards she felt like she was going to faint and had shallow breathing.  She reports she just sat down and relaxed and the symptoms subsided and she was able to been shower.  She does not feel that the lightheadedness and the shallow breathing was related to the actual exercise itself as it was her usual regimen that usually does not cause any difficulty breathing or lightheadedness sensations.  However this episode was scary for her and prompted her to stop exercising altogether.   She also reports feeling itchy when she gets overheated.    She has a history of eczema since childhood.  She feels that stress is triggering her current  issues with eczema.  She has problem areas on the tops of her feet, legs, arms, upper back.   She saw her dermatologist who recommended that she continue use of her triamcinolone as well as prescribed Atarax, Tagamet and advised use of a of Xyzal she has not gotten any of these prescriptions today as she wanted to discuss these today.  She also was prescribed an EpiPen which she also has not gotten and states that she was told there was a backorder on this medication.  She states she has gotten several rounds of steroid injections as well as oral steroid courses to help with her eczema from her dermatologist.  She feels that stress and possibly some foods may be exacerbating her eczema.  She states she changed to a plant-based diet for a period of time and then try to reintroduce things back into the diet.  When she went to put eggs back in diet she felt her skin "tingled" especially in her eczema patch areas.  She reports putting nuts and gluten back in the diet at the same time and does feel that her eczema seems to worsen.  She feels the gluten and egg are the biggest triggers of her eczema at this time. She took fish out of diet but does eat salmon.  She took red meats out but did eat steak about a week ago.  She states that she is handling red meat that her hands start to itch.  She  does it a lot of chicken.  She also feels that stress is a big trigger and reports she has been having a lot of stressful events in her life.  She got divorced in 2016 and she moved here from [place].  She reports she let her ex-husband in her life again and reports this was another let-down again and she became depressed and more stressed.  Then her son started having seizures which is another stressor for her.    She did see GI and was tested for Celiac disease which was negative.  She states she does not have any symptoms of reflux.  She takes xanax for anxiety which is worse at night and that is when she is itchier.  She reports  that the dermatologist hopeful that the hydroxyzine may be able to replace the Xanax in the evening.  She no longer has any issues of seasonal allergies and has no history of asthma.  Review of systems: Review of Systems  Constitutional: Positive for weight loss. Negative for chills, fever and malaise/fatigue.  HENT: Negative for congestion, ear discharge, ear pain, nosebleeds, sinus pain, sore throat and tinnitus.   Eyes: Negative for pain, discharge and redness.  Respiratory: Negative for cough, sputum production, shortness of breath and wheezing.   Cardiovascular: Negative for chest pain.  Gastrointestinal: Negative for abdominal pain, constipation, diarrhea, heartburn, nausea and vomiting.  Musculoskeletal: Negative for joint pain and myalgias.  Skin: Positive for itching and rash.  Neurological: Negative for dizziness, loss of consciousness and headaches.  Psychiatric/Behavioral: Positive for depression. The patient is nervous/anxious.     All other systems negative unless noted above in HPI  Past medical history: Past Medical History:  Diagnosis Date  . Breast hypertrophy 05/2014  . Dental crowns present   . Hx of bilateral breast reduction surgery 07/31/2014  . Idiopathic intracranial hypertension   . Pseudotumor cerebri   . S/P tooth extraction 06/04/2014    Past surgical history: Past Surgical History:  Procedure Laterality Date  . BREAST REDUCTION SURGERY Bilateral 06/11/2014   Procedure: BILATERAL BREAST REDUCTION ;  Surgeon: Karie Fetch, MD;  Location: [facility];  Service: Plastics;  Laterality: Bilateral;  . CESAREAN SECTION     x 2  . REDUCTION MAMMAPLASTY Bilateral     Family history:  Family History  Problem Relation Age of Onset  . Multiple sclerosis Mother   . Heart murmur Mother   . Hypertension Mother   . Asthma Mother   . Food Allergy Mother        pepper  . Eczema Mother   . Kidney disease Father   . Cancer - Other Father         throat  . Eczema Father   . Asthma Maternal Aunt   . Asthma Maternal Grandmother   . Allergic rhinitis Neg Hx   . Angioedema Neg Hx   . Atopy Neg Hx   . Immunodeficiency Neg Hx   . Urticaria Neg Hx     Social history:  Lives in a home with feeding and central cooling there is some concern for mold in her bathroom.  No concern for roaches.  She works as a Physiological scientist.  She denies a smoking history Divorced, 2 children    Medication List: Allergies as of 07/06/2017      Reactions   Flour Shortness Of Breath   Banana Other (See Comments)   TINGLING OF TONGUE   Codeine Itching  Medication List       Accurate as of 07/06/17 12:24 PM. Always use your most recent med list.          ALPRAZolam 1 MG tablet Commonly known as:  XANAX as needed.   diclofenac 75 MG EC tablet Commonly known as:  VOLTAREN   escitalopram 10 MG tablet Commonly known as:  LEXAPRO   Fish Oil 600 MG Caps Take 1 capsule by mouth.   gabapentin 100 MG capsule Commonly known as:  NEURONTIN Take 2 capsules (200 mg total) by mouth 3 (three) times daily.   latanoprost 0.005 % ophthalmic solution Commonly known as:  XALATAN   multivitamin tablet Take 1 tablet by mouth daily.   topiramate 25 MG tablet Commonly known as:  TOPAMAX Take 2 tablets (50 mg total) by mouth at bedtime.   triamcinolone cream 0.1 % Commonly known as:  KENALOG       Known medication allergies: Allergies  Allergen Reactions  . Flour Shortness Of Breath  . Banana Other (See Comments)    TINGLING OF TONGUE  . Codeine Itching     Physical examination: Blood pressure 120/65, pulse 70, temperature 97.8 F (36.6 C), temperature source Oral, resp. rate 16, height 5' 4.17" (1.63 m), weight 207 lb (93.9 kg), SpO2 97 %.  General: Alert, interactive, in no acute distress. HEENT: PERRLA, TMs pearly gray, turbinates non-edematous without discharge, post-pharynx non  erythematous. Neck: Supple without lymphadenopathy. Lungs: Clear to auscultation without wheezing, rhonchi or rales. {no increased work of breathing. CV: Normal S1, S2 without murmurs. Abdomen: Nondistended, nontender. Skin: Dry, hyperpigmented, thickened patches on the Top of her feet bilaterally, legs, forearms bilaterally, upper back. Extremities:  No clubbing, cyanosis or edema. Neuro:   Grossly intact.  Diagnositics/Labs:  Allergy testing: Select skin prick testing is positive to peanut, wheat, beef, walnut, almond, hazelnut.  equivalent to pecan.  Egg, cashew, chicken, flounder, Trout, shrimp, crab, tuna oyster, lobster, scallops, salmon, [place] nut were negative Allergy testing results were read and interpreted by provider, documented by clinical staff.  Assessment and plan:   Hives, exercise induced    -Concerned that this could go on to develop anaphylaxis as she did have an episode of feeling faint and shallowed breathing which resolved on its own.  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      - take long-acting antihistamine like Xyal 5mg  at least 30 minute prior to activities.  If 1 dose does not prevent hives with activities then she may may need to double the dose.    - don't exercise alone    - have access to epinephrine device (Epipen or AuviQ)-- follow emergency action plan in case of reaction during exercise     Atopic dermatitis     - Symptoms are severe with amount of body surface areas involved      - take hydroxyzine as directed by dermatologist     - Xyzal as above     - use triamcinolone as directed by dermatologist     - will have you add on Eucrisa to see if this has any added benefit to your triamcinolone     - we have discussed Dupixent as additonal therapy for eczema management including benefits, risks and protocol of administration.  Provided with Dupixent informational brochure     - food allergy  testing today is positive to peanuts, tree nuts as well as wheat and beef  Food allergy    -  testing today as above.  Recommend avoidance of above foods    - have access to epinephrine device and emergency action plan.   Follow-up 2-3 months or sooner if needed   No Follow-up on file.  I appreciate the opportunity to take part in Deanndra's care. Please do not hesitate to contact me with questions.  Sincerely,   Margo Aye, MD Allergy/Immunology [facility]

## 2017-07-08 ENCOUNTER — Telehealth: Payer: Self-pay | Admitting: *Deleted

## 2017-07-08 NOTE — Telephone Encounter (Signed)
Patient called office to discuss tree nut allergy and confirm what nuts to avoid.  Patient also wanted to clarify if she could eat pumpkin and sesame seeds. Reviewed tree nuts to avoid with patient and common foods, oils, sauces, etc that contain tree nuts.  Patient did not have FARE sheet on tree nut allergies.  Confirmed patients address and informed patient I would mail patient information sheet to her today.  Patient voiced understanding.

## 2017-07-12 ENCOUNTER — Ambulatory Visit: Payer: 59 | Admitting: Physical Therapy

## 2017-07-12 LAB — TRYPTASE: TRYPTASE: 4.8 ug/L (ref 2.2–13.2)

## 2017-07-12 LAB — CHRONIC URTICARIA: cu index: <1.8 (ref <10)

## 2017-07-12 LAB — ALLERGEN SOYBEAN: Soybean IgE: 0.18 kU/L — AB

## 2017-07-12 LAB — ALLERGEN MILK: Milk IgE: <0.1 kU/L

## 2017-07-14 ENCOUNTER — Encounter: Payer: Self-pay | Admitting: Physical Therapy

## 2017-07-14 ENCOUNTER — Ambulatory Visit: Payer: 59 | Attending: Adult Health | Admitting: Physical Therapy

## 2017-07-14 ENCOUNTER — Telehealth: Payer: Self-pay | Admitting: *Deleted

## 2017-07-14 DIAGNOSIS — M542 Cervicalgia: Secondary | ICD-10-CM | POA: Diagnosis present

## 2017-07-14 DIAGNOSIS — M62838 Other muscle spasm: Secondary | ICD-10-CM | POA: Diagnosis not present

## 2017-07-14 DIAGNOSIS — R293 Abnormal posture: Secondary | ICD-10-CM | POA: Diagnosis not present

## 2017-07-14 DIAGNOSIS — R29898 Other symptoms and signs involving the musculoskeletal system: Secondary | ICD-10-CM | POA: Diagnosis present

## 2017-07-14 NOTE — Patient Instructions (Signed)
Axial Extension (Chin Tuck)    Lying down, Pull chin in and lengthen back of neck (pressing back of neck down towards the pillow). Hold __5-10__ seconds while counting out loud. Repeat __10__ times. Do __1-2__ sessions per day.  http://gt2.exer.us/450   Copyright  VHI. All rights reserved.    Active Neck Rotation    Lying down with head in a comfortable position and chin gently tucked in, rotate head back and forth Left and right gently 10 times. And then to the right to point of a mild stretch. Hold __30_ seconds. Repeat to the left. Repeat __2-3__ times. Do __2__ sessions per day.  http://gt2.exer.us/11   Copyright  VHI. All rights reserved.

## 2017-07-14 NOTE — Therapy (Signed)
[facility] [address] [place], [place], [address] Phone: [phone]   Fax:  [phone]  Physical Therapy Evaluation  Patient Details  Name: Sherri Alvarado MRN: [id number] Date of Birth: [birth year] Referring Provider: Butch Penny NP (D[place]meier)  Encounter Date: 07/14/2017      PT End of Session - 07/14/17 1624    Visit Number 1   Number of Visits 17   Date for PT Re-Evaluation 09/12/17   Authorization Type UHC    Authorization Time Period 07/14/17  to  09/12/17   Authorization - Visit Number 1   Authorization - Number of Visits 90   PT Start Time 0851   PT Stop Time 0939   PT Time Calculation (min) 48 min   Activity Tolerance Treatment limited secondary to medical complications (Comment)  increaseing HA   Behavior During Therapy [facility] for tasks assessed/performed      Past Medical History:  Diagnosis Date  . Breast hypertrophy 05/2014  . Dental crowns present   . Hx of bilateral breast reduction surgery 07/31/2014  . Idiopathic intracranial hypertension   . Pseudotumor cerebri   . S/P tooth extraction 06/04/2014    Past Surgical History:  Procedure Laterality Date  . BREAST REDUCTION SURGERY Bilateral 06/11/2014   Procedure: BILATERAL BREAST REDUCTION ;  Surgeon: Karie Fetch, MD;  Location: [facility];  Service: Plastics;  Laterality: Bilateral;  . CESAREAN SECTION     x 2  . REDUCTION MAMMAPLASTY Bilateral     There were no vitals filed for this visit.       Subjective Assessment - 07/14/17 0852    Subjective Pseudotumor symptoms are coming back--headaches, neck pain, leg numbness at night (2-3 nights/wk). Beginning of Sept began breaking out in hives when exercising. She had an episode of lightheadedness and difficulty breathing. Saw MD and has exercise induced hives. Told not to exercise alone and to have Epi-pen. She is not exercising currently, but is gaining weight  (has lost 70 lbs in past 2 years). She currently has a lot of stress in her life and that always makes her symptoms worse.    Patient Stated Goals for the knot to ease up and decreased neck pain   Currently in Pain? Yes   Pain Score 5    Pain Location Neck   Pain Orientation Mid   Pain Descriptors / Indicators Nagging   Pain Type Chronic pain   Pain Onset 1 to 4 weeks ago   Pain Frequency Intermittent   Aggravating Factors  stress, weight gain   Pain Relieving Factors shoulder rolls and neck rotation (sometimes hears a weird sound like listening to the ocean through a shell)            [facility] PT Assessment - 07/14/17 0001      Assessment   Medical Diagnosis neck pain   Referring Provider Butch Penny NP ([place])   Onset Date/Surgical Date --  symptoms began again August   Hand Dominance Right   Prior Therapy for neck in 2015     Precautions   Precautions None     Restrictions   Weight Bearing Restrictions No     Balance Screen   Has the patient fallen in the past 6 months No   Has the patient had a decrease in activity level because of a fear of falling?  No   Is the patient reluctant to leave their home because of a fear of falling?  No     Home Environment   Living Environment Private residence   Living Arrangements Children  53 yo son     Prior Function   Level of Independence Independent   Vocation Full time employment   Vocation Requirements works from home; on computer all day     Cognition   Overall Cognitive Status Within Functional Limits for tasks assessed     Sensation   Light Touch Appears Intact  at rest denies sensation changes in arms/hands   Additional Comments strongly positive RUE neural tension test with numbness and tingling; negative LUE     Posture/Postural Control   Posture/Postural Control Postural limitations   Postural Limitations Forward head   Posture Comments not able to fully correct when asked to      ROM / Strength    AROM / PROM / Strength AROM     AROM   Overall AROM  Deficits   AROM Assessment Site Cervical   Cervical Flexion 15   Cervical Extension 40   Cervical - Right Side Bend 32   Cervical - Left Side Bend 30   Cervical - Right Rotation 75   Cervical - Left Rotation 80     Palpation   Palpation comment immediately to left of ~C6-7 firm trigger point palpable            Objective measurements completed on examination: See above findings.                  PT Education - 07/14/17 1621    Education provided Yes   Education Details confirmed ergonomics she has implemented for her home desk are appropriate; desk that can move between sitting and standing use a good option to consider; see HEP   Person(s) Educated Patient   Methods Explanation;Demonstration;Handout;Tactile cues;Verbal cues   Comprehension Verbalized understanding;Returned demonstration;Verbal cues required;Tactile cues required;Need further instruction          PT Short Term Goals - 07/14/17 1646      PT SHORT TERM GOAL #1   Title Patient will demostrate independence with basic HEP to manage cervical pain and headaches. (Target for all STGs 08/13/17)   Time 4   Period Weeks   Status New   Target Date 08/13/17     PT SHORT TERM GOAL #2   Title Patient will demonstrate improved cervical AROM: flexion >=30 degrees, bil lateral flexion >=40 degrees   Time 4   Period Weeks   Status New     PT SHORT TERM GOAL #3   Title Patient will report pain <= 4/10 at end of PT sessions.    Time 4   Period Weeks   Status New           PT Long Term Goals - 07/14/17 1653      PT LONG TERM GOAL #1   Title Patient will be independent with updated HEP (Target for LTGs 09/12/17)   Time 8   Period Weeks   Status New     PT LONG TERM GOAL #2   Title Patient will demonstrate improved cervical AROM: flexion 60   Time 8   Period Weeks   Status New     PT LONG TERM GOAL #3   Title Patient will report  pain level <= 3/10 at the end of therapy sessions.   Time 8   Period Weeks   Status New  Plan - 07/14/17 1632    Clinical Impression Statement Patient presents for neck pain with history of same when her stress level rises and her symptoms from pseudotumor cerebri are exacerbated. She recalls some stretches she learned from previous round of PT ~2 years ago when she lived in [place], however has not been able to diminish her symptoms.     History and Personal Factors relevant to plan of care: Pseudotumor cerebri, emotional/behavioral responses (stress/anxiety)   Clinical Presentation Evolving   Clinical Presentation due to: symptoms are progressively becoming worse despite her current routine of neck ROM   Clinical Decision Making Low   Rehab Potential Good   Clinical Impairments Affecting Rehab Potential stress/anxiety increase symptoms and pt reports high levels of stress currently   PT Frequency 2x / week   PT Duration 8 weeks   PT Treatment/Interventions ADLs/Self Care Home Management;Cryotherapy;Electrical Stimulation;Moist Heat;Traction;Ultrasound;Therapeutic exercise;Neuromuscular re-education;Patient/family education;Manual techniques;Passive range of motion;Dry needling   PT Next Visit Plan pt with increased symptoms with light traction (shooting pains up back of head/?referral pain from traps?) and ? was going to tolerate ex's given in supine; check tolerance and progress to sitting if more approp; progress HEP with stretching/strengthening; manual techniques   Consulted and Agree with Plan of Care Patient      Patient will benefit from skilled therapeutic intervention in order to improve the following deficits and impairments:  Decreased range of motion, Increased muscle spasms, Increased fascial restricitons, Pain, Impaired flexibility  Visit Diagnosis: Cervicalgia - Plan: PT plan of care cert/re-cert  Other muscle spasm - Plan: PT plan of care  cert/re-cert  Abnormal posture - Plan: PT plan of care cert/re-cert  Other symptoms and signs involving the musculoskeletal system - Plan: PT plan of care cert/re-cert     Problem List Patient Active Problem List   Diagnosis Date Noted  . Snoring 11/26/2014  . Depression headache 11/26/2014  . Hx of bilateral breast reduction surgery 07/31/2014  . Headache(784.0) 01/22/2014  . Meralgia paresthetica 12/17/2013    Zena Amos, PT 07/14/2017, 5:02 PM  [facility] [address] [place], [place], [address] Phone: [phone]   Fax:  [phone]  Name: TANEE HENERY MRN: [id number] Date of Birth: Aug 11, 1966

## 2017-07-14 NOTE — Telephone Encounter (Signed)
Per Dr Delorse Lek Please let her know that her wheat skin testing was positive thus this can indicate an allergy and would ask she avoid wheat (which is an gluten product).  If she is wanting testing for celiac disease then would defer to GI for that as may involve endoscopy for definitive diagnosis.

## 2017-07-15 NOTE — Telephone Encounter (Signed)
Spoke with patient. She has already seen a GI doctor to get tested for Celiac Disease. It was negative in the past. She just did not correlate gluten intolerance with Celiac Disease.

## 2017-07-18 ENCOUNTER — Ambulatory Visit: Payer: 59

## 2017-07-18 DIAGNOSIS — M542 Cervicalgia: Secondary | ICD-10-CM

## 2017-07-18 DIAGNOSIS — R29898 Other symptoms and signs involving the musculoskeletal system: Secondary | ICD-10-CM

## 2017-07-18 DIAGNOSIS — M62838 Other muscle spasm: Secondary | ICD-10-CM

## 2017-07-18 DIAGNOSIS — R293 Abnormal posture: Secondary | ICD-10-CM

## 2017-07-18 NOTE — Therapy (Signed)
[facility] [address] [place], [place], [address] Phone: [phone]   Fax:  [phone]  Physical Therapy Treatment  Patient Details  Name: Sherri Alvarado MRN: [id number] Date of Birth: [birth year] Referring Provider: Butch Penny NP ([place])   Encounter Date: 07/18/2017  PT End of Session - 07/18/17 0959    Visit Number  2    Number of Visits  17    Date for PT Re-Evaluation  09/12/17    Authorization Type  UHC     Authorization Time Period  07/14/17  to  09/12/17    Authorization - Visit Number  2    Authorization - Number of Visits  90    PT Start Time  0850 pt arrived late   pt arrived late   PT Stop Time  0931    PT Time Calculation (min)  41 min       Past Medical History:  Diagnosis Date  . Breast hypertrophy 05/2014  . Dental crowns present   . Hx of bilateral breast reduction surgery 07/31/2014  . Idiopathic intracranial hypertension   . Pseudotumor cerebri   . S/P tooth extraction 06/04/2014    Past Surgical History:  Procedure Laterality Date  . CESAREAN SECTION     x 2  . REDUCTION MAMMAPLASTY Bilateral     There were no vitals filed for this visit.  Subjective Assessment - 07/18/17 0853    Subjective  Pt reported pain and N/T hasn't changed much since last visit. Pt reports pain is 6-7/10 and pt has performed HEP "when I think about them". Pt reports a numbness sensation when sitting at rest.     Patient Stated Goals  for the knot to ease up and decreased neck pain    Currently in Pain?  No/denies at rest but pain with movement   at rest but pain with movement     Therex: Pt performed previous and new HEP with cues and demo for technique. Please see pt instructions for details.                [facility] Adult PT Treatment/Exercise - 07/18/17 0956      Manual Therapy   Manual Therapy  Soft tissue mobilization;Muscle Energy Technique    Manual therapy comments  No  incr. in pain or N/T during manual therapy, except during L contract/relax technique.    Soft tissue mobilization  PT performed B UT massage and ischemic compression to reduce trigger point pain.    Muscle Energy Technique  PT perform B UT contract/relax technique, with contraction for 10 seconds and then stretch for 30 seconds x1 rep/side, ceased L side due to incr. in numbness in L UE.              PT Education - 07/18/17 0958    Education provided  Yes    Education Details  PT reviewed HEP and added to HEP. PT educated pt on proper sleeping positions and seated desk ergonomics.    Person(s) Educated  Patient    Methods  Explanation;Demonstration;Verbal cues;Handout;Tactile cues    Comprehension  Returned demonstration;Verbalized understanding       PT Short Term Goals - 07/14/17 1646      PT SHORT TERM GOAL #1   Title  Patient will demostrate independence with basic HEP to manage cervical pain and headaches. (Target for all STGs 08/13/17)    Time  4    Period  Weeks    Status  New    Target Date  08/13/17      PT SHORT TERM GOAL #2   Title  Patient will demonstrate improved cervical AROM: flexion >=30 degrees, bil lateral flexion >=40 degrees    Time  4    Period  Weeks    Status  New      PT SHORT TERM GOAL #3   Title  Patient will report pain <= 4/10 at end of PT sessions.     Time  4    Period  Weeks    Status  New        PT Long Term Goals - 07/14/17 1653      PT LONG TERM GOAL #1   Title  Patient will be independent with updated HEP (Target for LTGs 09/12/17)    Time  8    Period  Weeks    Status  New      PT LONG TERM GOAL #2   Title  Patient will demonstrate improved cervical AROM: flexion 60    Time  8    Period  Weeks    Status  New      PT LONG TERM GOAL #3   Title  Patient will report pain level <= 3/10 at the end of therapy sessions.    Time  8    Period  Weeks    Status  New            Plan - 07/18/17 0959    Clinical Impression  Statement  Skilled session focused on review and additions to HEP to maintain gains made during PT sessions. Pt denied incr. pain during sesssion but did experience L UE N/T with L cx rotation (which decr. with decr. neck flexion during rotation) and during contraction/relax of L UT muscle. Pt would continue to benefit from skilled PT to improve quality of life and decr. pain.     Rehab Potential  Good    Clinical Impairments Affecting Rehab Potential  stress/anxiety increase symptoms and pt reports high levels of stress currently    PT Frequency  2x / week    PT Duration  8 weeks    PT Treatment/Interventions  ADLs/Self Care Home Management;Cryotherapy;Electrical Stimulation;Moist Heat;Traction;Ultrasound;Therapeutic exercise;Neuromuscular re-education;Patient/family education;Manual techniques;Passive range of motion;Dry needling    PT Next Visit Plan  Progress HEP with stretching/strengthening to seated as tolerated; manual techniques    Consulted and Agree with Plan of Care  Patient       Patient will benefit from skilled therapeutic intervention in order to improve the following deficits and impairments:  Decreased range of motion, Increased muscle spasms, Increased fascial restricitons, Pain, Impaired flexibility  Visit Diagnosis: Cervicalgia  Abnormal posture  Other symptoms and signs involving the musculoskeletal system  Other muscle spasm     Problem List Patient Active Problem List   Diagnosis Date Noted  . Snoring 11/26/2014  . Depression headache 11/26/2014  . Hx of bilateral breast reduction surgery 07/31/2014  . Headache(784.0) 01/22/2014  . Meralgia paresthetica 12/17/2013    Trevis Eden L 07/18/2017, 10:01 AM  [facility] [address] [place], [place], [address] Phone: [phone]   Fax:  [phone]  Name: Sherri Alvarado MRN: [id number] Date of Birth: Feb 05, 1966  Zerita BoersJennifer Jeter Tomey,  PT,DPT 07/18/17 10:02 AM Phone: [phone] Fax: [phone]

## 2017-07-18 NOTE — Patient Instructions (Signed)
Axial Extension (Chin Tuck)    Lying down, Pull chin in and lengthen back of neck (pressing back of neck down towards the pillow). Hold __5-10__ seconds while counting out loud. Repeat __10__ times. Do __1-2__ sessions per day.  http://gt2.exer.us/450   Copyright  VHI. All rights reserved.    Active Neck Rotation    Lying down with head in a comfortable position and chin gently tucked in, rotate head back and forth Left and right gently 10 times. And then to the right to point of a mild stretch. Hold __30_ seconds. Repeat to the left. Repeat __2-3__ times. Do __2__ sessions per day. HOLD UNTIL NUMBNESS STARTS IN R ARM.   http://gt2.exer.us/11   Copyright  VHI. All rights reserved.   BODY PILLOW: FOR WHEN YOU LIE ON YOUR SIDE (CAN GET AT [employer] OR [employer])  Pectoralis Stretch: Supine (Towel Roll)    Lie with rolled towel under spine, letting shoulders relax toward floor. Lie _2-3__ minutes. Do __1-2_ times per day.  http://ss.exer.us/355   Copyright  VHI. All rights reserved.   Scapular Retraction: Seated    PERFORM SEATED AND DON'T USE A BAND: Pull back until elbows are even with trunk. Keep elbows out from sides at 45, thumbs up. _5__ reps per set, _1-2__ sets per day, _7__ days per week.   http://ecce.exer.us/229   Copyright  VHI. All rights reserved.

## 2017-07-20 ENCOUNTER — Ambulatory Visit: Payer: 59 | Admitting: Physical Therapy

## 2017-07-21 ENCOUNTER — Ambulatory Visit: Payer: 59

## 2017-07-21 DIAGNOSIS — R29898 Other symptoms and signs involving the musculoskeletal system: Secondary | ICD-10-CM

## 2017-07-21 DIAGNOSIS — M542 Cervicalgia: Secondary | ICD-10-CM | POA: Diagnosis not present

## 2017-07-21 DIAGNOSIS — R293 Abnormal posture: Secondary | ICD-10-CM

## 2017-07-21 NOTE — Therapy (Signed)
[facility] [address] [place], [place], [address] Phone: [phone]   Fax:  [phone]  Physical Therapy Treatment  Patient Details  Name: Sherri Alvarado MRN: [id number] Date of Birth: [birth year] Referring Provider: Butch Penny NP ([place])   Encounter Date: 07/21/2017  PT End of Session - 07/21/17 0933    Visit Number  3    Number of Visits  17    Date for PT Re-Evaluation  09/12/17    Authorization Type  UHC     Authorization Time Period  07/14/17  to  09/12/17    Authorization - Visit Number  3    Authorization - Number of Visits  90    PT Start Time  0846    PT Stop Time  0929    PT Time Calculation (min)  43 min    Activity Tolerance  Patient tolerated treatment well;No increased pain    Behavior During Therapy  WFL for tasks assessed/performed       Past Medical History:  Diagnosis Date  . Breast hypertrophy 05/2014  . Dental crowns present   . Hx of bilateral breast reduction surgery 07/31/2014  . Idiopathic intracranial hypertension   . Pseudotumor cerebri   . S/P tooth extraction 06/04/2014    Past Surgical History:  Procedure Laterality Date  . CESAREAN SECTION     x 2  . REDUCTION MAMMAPLASTY Bilateral     There were no vitals filed for this visit.  Subjective Assessment - 07/21/17 0849    Subjective  Pt reported she took her first walk since last year, she did well and felt a little tingling after walk but felt good. Her neck/UE pain has incr. at night and a little throughout the day. Pt has performed some of the exercises a little bit. Pt feels like she needs to change her chair for work, as she feels worse after sitting.     Patient Stated Goals  for the knot to ease up and decreased neck pain    Currently in Pain?  Yes    Pain Score  7     Pain Location  Neck    Pain Orientation  Left;Posterior    Pain Descriptors / Indicators  Aching and stiffness   and stiffness   Pain  Type  Chronic pain    Pain Onset  More than a month ago    Pain Frequency  Intermittent    Aggravating Factors   sitting for prolonged periods of time, weight gain    Pain Relieving Factors  changing positions to ease pain         Therex; Pt performed seated postural exercises with cues and demo for technique. No incr. In pain. Please see pt instructions for HEP details.              [facility] Adult PT Treatment/Exercise - 07/21/17 0852      Manual Therapy   Manual Therapy  Soft tissue mobilization    Soft tissue mobilization  PT performed B UT, cervical extensors, and mid trap massage and ischemic compression to reduce trigger point pain. Pt reported reduced pain to 5/10 after manual therapy. Pt in prone with forehead support on rolled sheet for comfort.             PT Education - 07/21/17 0932    Education provided  Yes    Education Details  PT provided pt with additional seated postural exercises to reduce pain during work  hours. Pt reported she's going to purchase a body pillow this weekend too.    Person(s) Educated  Patient    Methods  Explanation;Demonstration    Comprehension  Verbalized understanding;Returned demonstration       PT Short Term Goals - 07/14/17 1646      PT SHORT TERM GOAL #1   Title  Patient will demostrate independence with basic HEP to manage cervical pain and headaches. (Target for all STGs 08/13/17)    Time  4    Period  Weeks    Status  New    Target Date  08/13/17      PT SHORT TERM GOAL #2   Title  Patient will demonstrate improved cervical AROM: flexion >=30 degrees, bil lateral flexion >=40 degrees    Time  4    Period  Weeks    Status  New      PT SHORT TERM GOAL #3   Title  Patient will report pain <= 4/10 at end of PT sessions.     Time  4    Period  Weeks    Status  New        PT Long Term Goals - 07/14/17 1653      PT LONG TERM GOAL #1   Title  Patient will be independent with updated HEP (Target for LTGs  09/12/17)    Time  8    Period  Weeks    Status  New      PT LONG TERM GOAL #2   Title  Patient will demonstrate improved cervical AROM: flexion 60    Time  8    Period  Weeks    Status  New      PT LONG TERM GOAL #3   Title  Patient will report pain level <= 3/10 at the end of therapy sessions.    Time  8    Period  Weeks    Status  New            Plan - 07/21/17 0933    Clinical Impression Statement  Pt demonstrated progress as she reported reduced pain after manual therapy and was able to perform seated postural exercises without incr. pain. Continue with POC.     Rehab Potential  Good    Clinical Impairments Affecting Rehab Potential  stress/anxiety increase symptoms and pt reports high levels of stress currently    PT Frequency  2x / week    PT Duration  8 weeks    PT Treatment/Interventions  ADLs/Self Care Home Management;Cryotherapy;Electrical Stimulation;Moist Heat;Traction;Ultrasound;Therapeutic exercise;Neuromuscular re-education;Patient/family education;Manual techniques;Passive range of motion;Dry needling    PT Next Visit Plan  Progress HEP with stretching/strengthening to seated as tolerated; manual techniques    Consulted and Agree with Plan of Care  Patient       Patient will benefit from skilled therapeutic intervention in order to improve the following deficits and impairments:  Decreased range of motion, Increased muscle spasms, Increased fascial restricitons, Pain, Impaired flexibility  Visit Diagnosis: Cervicalgia  Abnormal posture  Other symptoms and signs involving the musculoskeletal system     Problem List Patient Active Problem List   Diagnosis Date Noted  . Snoring 11/26/2014  . Depression headache 11/26/2014  . Hx of bilateral breast reduction surgery 07/31/2014  . Headache(784.0) 01/22/2014  . Meralgia paresthetica 12/17/2013    Tarrin Menn L 07/21/2017, 9:35 AM  [facility] [address] [place], [place], [address] Phone: [phone]  Fax:  [phone]  Name: Sherri Alvarado MRN: [id number] Date of Birth: [birth year]  Zerita BoersJennifer Esteven Overfelt, PT,DPT 07/21/17 9:36 AM Phone: [phone] Fax: [phone]

## 2017-07-21 NOTE — Patient Instructions (Signed)
Axial Extension (Chin Tuck)    Sitting down with pillow behind your head (against the wall). Pull chin in and lengthen back of neck (pressing back of neck down towards the pillow). Hold __2-3__ seconds while counting out loud. Repeat __5__ times. Do __2-3__ sessions per day.  http://gt2.exer.us/450  Copyright  VHI. All rights reserved.  Depression: Isometric (Side-Lying / Sitting)     Keep trunk straight, while seated.  Press shoulder blade toward feet. - Helper blocks movement.  Hold _2-3__ seconds. Relax. Repeat __5_ times. Repeat with other arm. Do _2-3__ sessions per day.   Copyright  VHI. All rights reserved.   Shoulder Circle    Begin with shoulders relaxed, back straight, head centered over spine. Slowly circle shoulders up, back, down, and forward to starting position. Repeat __10__ times in a continuous motion. As needed throughout the day.  Copyright  VHI. All rights reserved.

## 2017-07-22 DIAGNOSIS — Z9889 Other specified postprocedural states: Secondary | ICD-10-CM | POA: Diagnosis not present

## 2017-07-27 ENCOUNTER — Ambulatory Visit: Payer: 59

## 2017-07-28 ENCOUNTER — Ambulatory Visit: Payer: 59

## 2017-07-28 DIAGNOSIS — R29898 Other symptoms and signs involving the musculoskeletal system: Secondary | ICD-10-CM

## 2017-07-28 DIAGNOSIS — M542 Cervicalgia: Secondary | ICD-10-CM

## 2017-07-28 DIAGNOSIS — Z91018 Allergy to other foods: Secondary | ICD-10-CM | POA: Diagnosis not present

## 2017-07-28 DIAGNOSIS — R293 Abnormal posture: Secondary | ICD-10-CM

## 2017-07-28 DIAGNOSIS — L309 Dermatitis, unspecified: Secondary | ICD-10-CM | POA: Diagnosis not present

## 2017-07-28 NOTE — Therapy (Signed)
[facility] [address] [place], [place], [address] Phone: [phone]   Fax:  [phone]  Physical Therapy Treatment  Patient Details  Name: Sherri Alvarado MRN: [id number] Date of Birth: 04/09/1966 Referring Provider: Butch Penny NP (Dohmeier)   Encounter Date: 07/28/2017  PT End of Session - 07/28/17 0901    Visit Number  4    Number of Visits  17    Date for PT Re-Evaluation  09/12/17    Authorization Type  UHC     Authorization Time Period  07/14/17  to  09/12/17    Authorization - Visit Number  4    Authorization - Number of Visits  90    PT Start Time  0857    PT Stop Time  0930    PT Time Calculation (min)  33 min    Activity Tolerance  Patient tolerated treatment well;No increased pain    Behavior During Therapy  WFL for tasks assessed/performed       Past Medical History:  Diagnosis Date  . Breast hypertrophy 05/2014  . Dental crowns present   . Hx of bilateral breast reduction surgery 07/31/2014  . Idiopathic intracranial hypertension   . Pseudotumor cerebri   . S/P tooth extraction 06/04/2014    Past Surgical History:  Procedure Laterality Date  . BREAST REDUCTION SURGERY Bilateral 06/11/2014   Procedure: BILATERAL BREAST REDUCTION ;  Surgeon: Karie Fetch, MD;  Location: [facility];  Service: Plastics;  Laterality: Bilateral;  . CESAREAN SECTION     x 2  . REDUCTION MAMMAPLASTY Bilateral     There were no vitals filed for this visit.  Subjective Assessment - 07/28/17 0901    Subjective  No new complaints, Pt reports doing HEP at least once a day but usually doesnt remember until the pain starts.     Currently in Pain?  Yes    Pain Score  5     Pain Location  Neck radiating to right arm    Pain Descriptors / Indicators  Aching    Pain Onset  More than a month ago    Pain Frequency  Intermittent       [facility] Adult PT Treatment/Exercise - 07/28/17 1025      Exercises   Exercises  Shoulder;Neck      Neck Exercises: Machines for Strengthening   UBE (Upper Arm Bike)  fwds/bkwds 2 mins each, 1.5      Neck Exercises: Seated   Shoulder Rolls  Backwards;10 reps;Other (comment);Limitations 2 sets    Shoulder Rolls Limitations  Pt seated on edge of mat, pt tolerated this well with cues for slow controlled movements.       Shoulder Exercises: Prone   Retraction  AROM;Strengthening;Both;10 reps;Other (comment);Limitations 5 sec hold    Retraction Limitations  Pt prone on mat with towel roll positioned underneath forehead, pt lying with arms by side then instructed to lift arms up and in and hold for 5 secs.       Shoulder Exercises: Stretch   Other Shoulder Stretches  Pt seated on corner edge of mat, pt reaching down to grab mat with one arm, leaning laterally in opposite direction until the pt feels a good stretch. Pt pt reports not feeling pain but hearing a "popping" sound during this stretch. Pt performed x3reps/10sec holds BUE      Manual Therapy   Manual Therapy  Manual Traction    Manual therapy comments  Pt in  supine with therapist performing manual traction with single UE and providing upper trap stretch with other UE, Pt tolerated this well and the stretch increased with each rep, pt much tighter on the left side.   Pt in supine with therapist performing manual traction and instructing the pt to retract and depress scapula. Pt tolerated this well.                PT Short Term Goals - 07/14/17 1646      PT SHORT TERM GOAL #1   Title  Patient will demostrate independence with basic HEP to manage cervical pain and headaches. (Target for all STGs 08/13/17)    Time  4    Period  Weeks    Status  New    Target Date  08/13/17      PT SHORT TERM GOAL #2   Title  Patient will demonstrate improved cervical AROM: flexion >=30 degrees, bil lateral flexion >=40 degrees    Time  4    Period  Weeks    Status  New      PT SHORT TERM GOAL  #3   Title  Patient will report pain <= 4/10 at end of PT sessions.     Time  4    Period  Weeks    Status  New        PT Long Term Goals - 07/14/17 1653      PT LONG TERM GOAL #1   Title  Patient will be independent with updated HEP (Target for LTGs 09/12/17)    Time  8    Period  Weeks    Status  New      PT LONG TERM GOAL #2   Title  Patient will demonstrate improved cervical AROM: flexion 60    Time  8    Period  Weeks    Status  New      PT LONG TERM GOAL #3   Title  Patient will report pain level <= 3/10 at the end of therapy sessions.    Time  8    Period  Weeks    Status  New            Plan - 07/28/17 0930    Clinical Impression Statement  Pt reported decreased pain at end of session 4/10. Pt tolerated treatment well with no limitations due to pain. Todays session focused on cervical manual therapy and postural exercises. Pt would benefit from continued PT sessions to progress towards goals.     Rehab Potential  Good    Clinical Impairments Affecting Rehab Potential  stress/anxiety increase symptoms and pt reports high levels of stress currently    PT Frequency  2x / week    PT Duration  8 weeks    PT Treatment/Interventions  ADLs/Self Care Home Management;Cryotherapy;Electrical Stimulation;Moist Heat;Traction;Ultrasound;Therapeutic exercise;Neuromuscular re-education;Patient/family education;Manual techniques;Passive range of motion;Dry needling    PT Next Visit Plan  Progress HEP with stretching/strengthening to seated as tolerated; manual techniques, postural exercises with band.     Consulted and Agree with Plan of Care  Patient       Patient will benefit from skilled therapeutic intervention in order to improve the following deficits and impairments:  Decreased range of motion, Increased muscle spasms, Increased fascial restricitons, Pain, Impaired flexibility  Visit Diagnosis: Cervicalgia  Abnormal posture  Other symptoms and signs involving the  musculoskeletal system     Problem List Patient Active Problem List   Diagnosis Date Noted  .  Snoring 11/26/2014  . Depression headache 11/26/2014  . Hx of bilateral breast reduction surgery 07/31/2014  . Headache(784.0) 01/22/2014  . Meralgia paresthetica 12/17/2013   Sherri Alvarado, SPTA  Sherri Alvarado 07/28/2017, 10:47 AM  [facility] [address] [place], [place], [address] Phone: [phone]   Fax:  [phone]  Name: Sherri Alvarado MRN: [id number] Date of Birth: 09/22/1965

## 2017-08-01 ENCOUNTER — Ambulatory Visit: Payer: 59

## 2017-08-03 ENCOUNTER — Ambulatory Visit: Payer: 59

## 2017-08-08 ENCOUNTER — Ambulatory Visit: Payer: 59

## 2017-08-08 DIAGNOSIS — R29898 Other symptoms and signs involving the musculoskeletal system: Secondary | ICD-10-CM

## 2017-08-08 DIAGNOSIS — M542 Cervicalgia: Secondary | ICD-10-CM | POA: Diagnosis not present

## 2017-08-08 DIAGNOSIS — R293 Abnormal posture: Secondary | ICD-10-CM

## 2017-08-08 NOTE — Therapy (Signed)
[facility] [address] [place], [place], [address] Phone: [phone]   Fax:  [phone]  Physical Therapy Treatment  Patient Details  Name: Sherri Alvarado MRN: [id number] Date of Birth: 08/25/1966 Referring Provider: Ward Givens NP (Dohmeier)   Encounter Date: 08/08/2017  PT End of Session - 08/08/17 0918    Visit Number  5    Number of Visits  17    Date for PT Re-Evaluation  09/12/17    Authorization Type  UHC     Authorization Time Period  07/14/17  to  09/12/17    Authorization - Visit Number  5    Authorization - Number of Visits  67    PT Start Time  2694    PT Stop Time  0930    PT Time Calculation (min)  38 min    Activity Tolerance  Patient tolerated treatment well    Behavior During Therapy  [facility] for tasks assessed/performed       Past Medical History:  Diagnosis Date  . Breast hypertrophy 05/2014  . Dental crowns present   . Hx of bilateral breast reduction surgery 07/31/2014  . Idiopathic intracranial hypertension   . Pseudotumor cerebri   . S/P tooth extraction 06/04/2014    Past Surgical History:  Procedure Laterality Date  . BREAST REDUCTION SURGERY Bilateral 06/11/2014   Procedure: BILATERAL BREAST REDUCTION ;  Surgeon: Charlene Brooke, MD;  Location: [facility];  Service: Plastics;  Laterality: Bilateral;  . CESAREAN SECTION     x 2  . REDUCTION MAMMAPLASTY Bilateral     There were no vitals filed for this visit.  Subjective Assessment - 08/08/17 0855    Subjective  Pt reported she missed the last few appt's due to holidays and her aunt passing away. Pt reported she bought a body pillow and feels more relaxed and less tightness. Pt hasn't performed HEP since Friday. Pt reported pain is on average 3/10 after PT sessions. Pt reported she's continuing to walk more.     Patient Stated Goals  for the knot to ease up and decreased neck pain    Currently in Pain?   Yes    Pain Score  3     Pain Location  Neck    Pain Orientation  Posterior    Pain Descriptors / Indicators  -- Stiffness    Pain Radiating Towards  LUE    Pain Onset  More than a month ago    Pain Frequency  Intermittent    Aggravating Factors   sitting for prolonger periods of time    Pain Relieving Factors  changing positions and HEP         [facility] PT Assessment - 08/08/17 0900      ROM / Strength   AROM / PROM / Strength  AROM      AROM   Overall AROM   Deficits    AROM Assessment Site  Cervical    Cervical Flexion  46 pt reported 7/10 neck pain    Cervical - Right Side Bend  37 4/10 neck pain    Cervical - Left Side Bend  32 4/10 neck pain         Therex: Pt performed HEP with cues for technique. Please see pt instructions for HEP details. No incr. In pain noted.                  PT Education - 08/08/17 8546  Education provided  Yes    Education Details  PT reviewed HEP and educated pt on the importance of performing as instructed.     Person(s) Educated  Patient    Methods  Explanation;Demonstration;Verbal cues;Handout    Comprehension  Returned demonstration;Verbalized understanding       PT Short Term Goals - 08/08/17 0919      PT SHORT TERM GOAL #1   Title  Patient will demostrate independence with basic HEP to manage cervical pain and headaches. (Target for all STGs 08/13/17)    Time  4    Period  Weeks    Status  Partially Met      PT SHORT TERM GOAL #2   Title  Patient will demonstrate improved cervical AROM: flexion >=30 degrees, bil lateral flexion >=40 degrees    Time  4    Period  Weeks    Status  Partially Met      PT SHORT TERM GOAL #3   Title  Patient will report pain <= 4/10 at end of PT sessions.     Time  4    Period  Weeks    Status  Achieved        PT Long Term Goals - 07/14/17 1653      PT LONG TERM GOAL #1   Title  Patient will be independent with updated HEP (Target for LTGs 09/12/17)    Time  8     Period  Weeks    Status  New      PT LONG TERM GOAL #2   Title  Patient will demonstrate improved cervical AROM: flexion 60    Time  8    Period  Weeks    Status  New      PT LONG TERM GOAL #3   Title  Patient will report pain level <= 3/10 at the end of therapy sessions.    Time  8    Period  Weeks    Status  New            Plan - 08/08/17 4540    Clinical Impression Statement  Pt demonstrated progress as she met STG 3 and partially met STGs 1 and 2. Pt reported she has been performing HEP intermittently and missed one week of PT, which could contribute to only partially meeting goals. Pt would continue to benefit from skilled PT to progress towards goals.     Rehab Potential  Good    Clinical Impairments Affecting Rehab Potential  stress/anxiety increase symptoms and pt reports high levels of stress currently    PT Frequency  2x / week    PT Duration  8 weeks    PT Treatment/Interventions  ADLs/Self Care Home Management;Cryotherapy;Electrical Stimulation;Moist Heat;Traction;Ultrasound;Therapeutic exercise;Neuromuscular re-education;Patient/family education;Manual techniques;Passive range of motion;Dry needling    PT Next Visit Plan  Progress HEP with stretching/strengthening to seated as tolerated; manual techniques, postural exercises with band.     Consulted and Agree with Plan of Care  Patient       Patient will benefit from skilled therapeutic intervention in order to improve the following deficits and impairments:  Decreased range of motion, Increased muscle spasms, Increased fascial restricitons, Pain, Impaired flexibility  Visit Diagnosis: Cervicalgia  Abnormal posture  Other symptoms and signs involving the musculoskeletal system     Problem List Patient Active Problem List   Diagnosis Date Noted  . Snoring 11/26/2014  . Depression headache 11/26/2014  . Hx of bilateral breast reduction surgery 07/31/2014  .  Headache(784.0) 01/22/2014  . Meralgia  paresthetica 12/17/2013    Dajane Valli L 08/08/2017, 9:20 AM  [facility] [address] [place], [place], [address] Phone: [phone]   Fax:  [phone]  Name: Sherri Alvarado MRN: [id number] Date of Birth: Aug 07, 1966  Geoffry Paradise, PT,DPT 08/08/17 9:21 AM Phone: [phone] Fax: [phone]

## 2017-08-08 NOTE — Patient Instructions (Signed)
Axial Extension (Chin Tuck)    Lying down, Pull chin in and lengthen back of neck (pressing back of neck down towards the pillow). Hold __5-10__ seconds while counting out loud. Repeat __10__ times. Do __1-2__ sessions per day.  http://gt2.exer.us/450  Copyright  VHI. All rights reserved.   Active Neck Rotation    Lying down with head in a comfortable position and chin gently tucked in, rotate head back and forth Left and right gently 10 times. And then to the right to point of a mild stretch. Hold __30_ seconds. Repeat to the left. Repeat __2-3__ times. Do __2__ sessions per day. HOLD UNTIL NUMBNESS STARTS IN L ARM.   http://gt2.exer.us/11  Copyright  VHI. All rights reserved.  BODY PILLOW: FOR WHEN YOU LIE ON YOUR SIDE (CAN GET AT [employer] OR [employer])  Pectoralis Stretch: Supine (Towel Roll)    Lie with rolled towel under spine, letting shoulders relax toward floor. Lie _2-3__ minutes. Do __1-2_ times per day.  http://ss.exer.us/355   Copyright  VHI. All rights reserved.   Scapular Retraction: Seated    PERFORM SEATED AND DON'T USE A BAND: Pull back until elbows are even with trunk. Keep elbows out from sides at 45, thumbs up. _5__ reps per set, _1-2__ sets per day, _7__ days per week.   http://ecce.exer.us/229   Copyright  VHI. All rights reserved.   Depression: Isometric (Side-Lying / Sitting)     Keep trunk straight, while seated.  Press shoulder blade toward feet. - Helper blocks movement.  Hold _2-3__ seconds. Relax. Repeat __5_ times. Repeat with other arm. Do _2-3__ sessions per day.   Copyright  VHI. All rights reserved.   Shoulder Circle    Begin with shoulders relaxed, back straight, head centered over spine. Slowly circle shoulders up, back, down, and forward to starting position. Repeat __10__ times in a continuous motion. As needed throughout the day.  Copyright  VHI. All rights reserved.

## 2017-08-09 DIAGNOSIS — Z9889 Other specified postprocedural states: Secondary | ICD-10-CM | POA: Diagnosis not present

## 2017-08-10 ENCOUNTER — Ambulatory Visit: Payer: 59

## 2017-08-17 ENCOUNTER — Ambulatory Visit: Payer: 59 | Attending: Adult Health

## 2017-08-17 DIAGNOSIS — R293 Abnormal posture: Secondary | ICD-10-CM | POA: Insufficient documentation

## 2017-08-17 DIAGNOSIS — M62838 Other muscle spasm: Secondary | ICD-10-CM | POA: Insufficient documentation

## 2017-08-17 DIAGNOSIS — R29898 Other symptoms and signs involving the musculoskeletal system: Secondary | ICD-10-CM

## 2017-08-17 DIAGNOSIS — M542 Cervicalgia: Secondary | ICD-10-CM | POA: Insufficient documentation

## 2017-08-17 NOTE — Therapy (Signed)
Sherri Alvarado [address] [place], [place], [address] Phone: [phone]   Fax:  [phone]  Physical Therapy Treatment  Patient Details  Name: Sherri Alvarado MRN: [id number] Date of Birth: [birth year] Referring Provider: Ward Givens NP (Dohmeier)   Encounter Date: 08/17/2017  PT End of Session - 08/17/17 0927    Visit Number  6    Number of Visits  17    Date for PT Re-Evaluation  09/12/17    Authorization Type  UHC     Authorization Time Period  07/14/17  to  09/12/17    Authorization - Visit Number  6    Authorization - Number of Visits  90    PT Start Time  0848    PT Stop Time  0927    PT Time Calculation (min)  39 min    Activity Tolerance  Patient tolerated treatment well    Behavior During Therapy  [facility] for tasks assessed/performed       Past Medical History:  Diagnosis Date  . Breast hypertrophy 05/2014  . Dental crowns present   . Hx of bilateral breast reduction surgery 07/31/2014  . Idiopathic intracranial hypertension   . Pseudotumor cerebri   . S/P tooth extraction 06/04/2014    Past Surgical History:  Procedure Laterality Date  . BREAST REDUCTION SURGERY Bilateral 06/11/2014   Procedure: BILATERAL BREAST REDUCTION ;  Surgeon: Charlene Brooke, MD;  Location: [facility];  Service: Plastics;  Laterality: Bilateral;  . CESAREAN SECTION     x 2  . REDUCTION MAMMAPLASTY Bilateral     There were no vitals filed for this visit.  Subjective Assessment - 08/17/17 0851    Subjective  Pt reported she missed last visit due to illness. Pt reports she has been feeling better overall, as she's been resting.     Patient Stated Goals  for the knot to ease up and decreased neck pain    Currently in Pain?  Yes    Pain Score  3     Pain Location  Neck    Pain Orientation  Posterior    Pain Descriptors / Indicators  Sore stiffness    Pain Type  Chronic pain    Pain Radiating Towards   LUE    Pain Onset  More than a month ago    Pain Frequency  Intermittent    Aggravating Factors   sitting for prolonged periods of time    Pain Relieving Factors  changing positions and HEP                      [facility] Adult PT Treatment/Exercise - 08/17/17 0853      Neck Exercises: Supine   Other Supine Exercise  Pt performed isometric contractions in all directions with PT providing light to moderate resistance for 5 sec. 2x5 reps in each direction. Pt performed B scapular retraction 2x5 reps, cues for technique. Pt also performed seated B scapular retraction x5 reps.     Manual Therapy   Manual Therapy  Manual Traction;Soft tissue mobilization;Muscle Energy Technique    Manual therapy comments  Pt in supine with therapist performing manual traction with single UE and providing upper trap stretch with other UE, Pt tolerated this well and the stretch increased with each rep, pt much tighter on the left side.   Pt in supine with therapist performing manual traction and instructing the pt to retract and depress scapula. Pt tolerated  this well.     Soft tissue mobilization  PT performed B UT, cervical extensors, and mid trap massage and ischemic compression to reduce trigger point pain. Pt reported reduced pain to 1/10 after manual therapy.    Manual Traction  PT performed 5x30-60sec.              PT Education - 08/17/17 0920    Education provided  Yes    Education Details  PT educated pt on the importance of performing HEP as prescribed.        PT Short Term Goals - 08/08/17 0919      PT SHORT TERM GOAL #1   Title  Patient will demostrate independence with basic HEP to manage cervical pain and headaches. (Target for all STGs 08/13/17)    Time  4    Period  Weeks    Status  Partially Met      PT SHORT TERM GOAL #2   Title  Patient will demonstrate improved cervical AROM: flexion >=30 degrees, bil lateral flexion >=40 degrees    Time  4    Period  Weeks     Status  Partially Met      PT SHORT TERM GOAL #3   Title  Patient will report pain <= 4/10 at end of PT sessions.     Time  4    Period  Weeks    Status  Achieved        PT Long Term Goals - 07/14/17 1653      PT LONG TERM GOAL #1   Title  Patient will be independent with updated HEP (Target for LTGs 09/12/17)    Time  8    Period  Weeks    Status  New      PT LONG TERM GOAL #2   Title  Patient will demonstrate improved cervical AROM: flexion 60    Time  8    Period  Weeks    Status  New      PT LONG TERM GOAL #3   Title  Patient will report pain level <= 3/10 at the end of therapy sessions.    Time  8    Period  Weeks    Status  New            Plan - 08/17/17 5498    Clinical Impression Statement  Pt demonstrated progress, as she reported 1/10 pain after manual therapy and scapular/cervical exercises. Pt continues to experience incr. LUE N/T during L cervical rotation, indicating closing restriction. PT will consult with dry needling PT, to determine if pt is appropriate candidate for dry needling 2/2 incr. muscle guarding. Continue with POC.     Rehab Potential  Good    Clinical Impairments Affecting Rehab Potential  stress/anxiety increase symptoms and pt reports high levels of stress currently    PT Frequency  2x / week    PT Duration  8 weeks    PT Treatment/Interventions  ADLs/Self Care Home Management;Cryotherapy;Electrical Stimulation;Moist Heat;Traction;Ultrasound;Therapeutic exercise;Neuromuscular re-education;Patient/family education;Manual techniques;Passive range of motion;Dry needling    PT Next Visit Plan  Dry needling assessment? Progress HEP with stretching/strengthening to seated as tolerated; manual techniques, postural exercises with band.     Consulted and Agree with Plan of Care  Patient       Patient will benefit from skilled therapeutic intervention in order to improve the following deficits and impairments:  Decreased range of motion,  Increased muscle spasms, Increased fascial restricitons, Pain, Impaired  flexibility  Visit Diagnosis: Cervicalgia  Abnormal posture  Other symptoms and signs involving the musculoskeletal system     Problem List Patient Active Problem List   Diagnosis Date Noted  . Snoring 11/26/2014  . Depression headache 11/26/2014  . Hx of bilateral breast reduction surgery 07/31/2014  . Headache(784.0) 01/22/2014  . Meralgia paresthetica 12/17/2013    Celestino Ackerman L 08/17/2017, 9:29 AM  [facility] [address] [place], [place], [address] Phone: [phone]   Fax:  [phone]  Name: SYANA DEGRAFFENREID MRN: [id number] Date of Birth: 04-Jul-1966  Geoffry Paradise, PT,DPT 08/17/17 9:30 AM Phone: [phone] Fax: [phone]

## 2017-08-19 ENCOUNTER — Encounter: Payer: Self-pay | Admitting: Physical Therapy

## 2017-08-19 ENCOUNTER — Ambulatory Visit: Payer: 59 | Admitting: Physical Therapy

## 2017-08-19 DIAGNOSIS — R29898 Other symptoms and signs involving the musculoskeletal system: Secondary | ICD-10-CM

## 2017-08-19 DIAGNOSIS — R293 Abnormal posture: Secondary | ICD-10-CM

## 2017-08-19 DIAGNOSIS — M62838 Other muscle spasm: Secondary | ICD-10-CM

## 2017-08-19 DIAGNOSIS — M542 Cervicalgia: Secondary | ICD-10-CM

## 2017-08-19 NOTE — Patient Instructions (Signed)

## 2017-08-19 NOTE — Therapy (Signed)
[facility] [address] [place], [place], [address] Phone: [phone]   Fax:  [phone]  Physical Therapy Treatment  Patient Details  [name] MRN: [id number] Date of Birth: 1965-11-25 Referring Provider: Ward Givens NP ([facility])   Encounter Date: 08/19/2017  PT End of Session - 08/19/17 0928    Visit Number  7    Number of Visits  17    Date for PT Re-Evaluation  09/12/17    Authorization Type  UHC     Authorization Time Period  07/14/17  to  09/12/17    Authorization - Visit Number  7    Authorization - Number of Visits  90    PT Start Time  0845    PT Stop Time  0926    PT Time Calculation (min)  41 min    Activity Tolerance  Patient tolerated treatment well    Behavior During Therapy  [facility] for tasks assessed/performed       Past Medical History:  Diagnosis Date  . Breast hypertrophy 05/2014  . Dental crowns present   . Hx of bilateral breast reduction surgery 07/31/2014  . Idiopathic intracranial hypertension   . Pseudotumor cerebri   . S/P tooth extraction 06/04/2014    Past Surgical History:  Procedure Laterality Date  . BREAST REDUCTION SURGERY Bilateral 06/11/2014   Procedure: BILATERAL BREAST REDUCTION ;  Surgeon: Charlene Brooke, MD;  Location: [facility];  Service: Plastics;  Laterality: Bilateral;  . CESAREAN SECTION     x 2  . REDUCTION MAMMAPLASTY Bilateral     There were no vitals filed for this visit.  Subjective Assessment - 08/19/17 0847    Subjective  not doing as well as last session.  "I think I overdid it with my exercises. Anderson Malta wanted me to be more consistent."      Patient Stated Goals  for the knot to ease up and decreased neck pain    Currently in Pain?  Yes    Pain Score  7     Pain Location  Neck    Pain Orientation  Posterior;Left    Pain Descriptors / Indicators  Sore    Pain Type  Chronic pain    Pain Radiating Towards  LUE     Pain Onset  More than a month ago    Pain Frequency  Intermittent    Aggravating Factors   prolonged sitting    Pain Relieving Factors  changing positions and HEP                      [facility] Adult PT Treatment/Exercise - 08/19/17 0906      Self-Care   Self-Care  Other Self-Care Comments    Other Self-Care Comments   eudcated on DN and risks/benefits; answered pt's questions and pt requested to defer at this time      Neck Exercises: Theraband   Shoulder External Rotation  15 reps;Red    Horizontal ABduction  15 reps;Red      Neck Exercises: Supine   Neck Retraction  15 reps;5 secs      Manual Therapy   Manual Therapy  Soft tissue mobilization;Myofascial release;Manual Traction    Manual therapy comments  pt supine    Soft tissue mobilization  Lt UT, LS and cervical paraspinals    Myofascial Release  TPR to Lt upper trap, levator and cervical paraspinals; suboccipital release    Manual Traction  5x30 sec with 30 sec rest             PT Education - 08/19/17 0927    Education provided  Yes    Education Details  DN    Person(s) Educated  Patient    Methods  Explanation;Handout    Comprehension  Verbalized understanding       PT Short Term Goals - 08/08/17 0919      PT SHORT TERM GOAL #1   Title  Patient will demostrate independence with basic HEP to manage cervical pain and headaches. (Target for all STGs 08/13/17)    Time  4    Period  Weeks    Status  Partially Met      PT SHORT TERM GOAL #2   Title  Patient will demonstrate improved cervical AROM: flexion >=30 degrees, bil lateral flexion >=40 degrees    Time  4    Period  Weeks    Status  Partially Met      PT SHORT TERM GOAL #3   Title  Patient will report pain <= 4/10 at end of PT sessions.     Time  4    Period  Weeks    Status  Achieved        PT Long Term Goals - 07/14/17 1653      PT LONG TERM GOAL #1   Title  Patient will be independent with updated HEP (Target for LTGs  09/12/17)    Time  8    Period  Weeks    Status  New      PT LONG TERM GOAL #2   Title  Patient will demonstrate improved cervical AROM: flexion 60    Time  8    Period  Weeks    Status  New      PT LONG TERM GOAL #3   Title  Patient will report pain level <= 3/10 at the end of therapy sessions.    Time  8    Period  Weeks    Status  New            Plan - 08/19/17 8250    Clinical Impression Statement  Pt tolerated session well today with decrease in pain following.  Long discussion with pt re: DN and at this pt requesting to defer and will consider at another date.  Feel she will be a good candidate for DN.  Slowly progressing well towards goals.  Recommended HEP 1x/day consistently and shoulder rolls multiple times a day.  Pt verbalized understanding.    PT Treatment/Interventions  ADLs/Self Care Home Management;Cryotherapy;Electrical Stimulation;Moist Heat;Traction;Ultrasound;Therapeutic exercise;Neuromuscular re-education;Patient/family education;Manual techniques;Passive range of motion;Dry needling    PT Next Visit Plan  see if pt willing to try DN, Progress HEP with stretching/strengthening to seated as tolerated; manual techniques, postural exercises with band.     Consulted and Agree with Plan of Care  Patient       Patient will benefit from skilled therapeutic intervention in order to improve the following deficits and impairments:  Decreased range of motion, Increased muscle spasms, Increased fascial restricitons, Pain, Impaired flexibility  Visit Diagnosis: Cervicalgia  Abnormal posture  Other symptoms and signs involving the musculoskeletal system  Other muscle spasm     Problem List Patient Active Problem List   Diagnosis Date Noted  . Snoring 11/26/2014  . Depression headache 11/26/2014  . Hx of bilateral breast reduction surgery 07/31/2014  . Headache(784.0) 01/22/2014  . Meralgia paresthetica 12/17/2013  Laureen Abrahams, PT,  DPT 08/19/17 9:30 AM    [facility] [address] [place], [place], [address] Phone: [phone]   Fax:  [phone]  [name] MRN: [id number] Date of Birth: 1966-03-12

## 2017-08-22 ENCOUNTER — Ambulatory Visit: Payer: 59

## 2017-08-24 ENCOUNTER — Ambulatory Visit: Payer: 59

## 2017-08-29 ENCOUNTER — Ambulatory Visit: Payer: 59

## 2017-08-31 ENCOUNTER — Ambulatory Visit: Payer: 59

## 2017-09-01 ENCOUNTER — Ambulatory Visit: Payer: 59

## 2017-09-01 ENCOUNTER — Ambulatory Visit: Payer: Self-pay | Admitting: Adult Health

## 2017-09-01 DIAGNOSIS — R29898 Other symptoms and signs involving the musculoskeletal system: Secondary | ICD-10-CM

## 2017-09-01 DIAGNOSIS — M542 Cervicalgia: Secondary | ICD-10-CM | POA: Diagnosis not present

## 2017-09-01 DIAGNOSIS — R293 Abnormal posture: Secondary | ICD-10-CM

## 2017-09-01 NOTE — Therapy (Signed)
[facility] [address] [place], [place], [address] Phone: [phone]   Fax:  [phone]  Physical Therapy Treatment  Patient Details  Name: Sherri Alvarado MRN: [id number] Date of Birth: 11/15/1965 Referring Provider: Ward Givens NP ([facility])   Encounter Date: 09/01/2017  PT End of Session - 09/01/17 0925    Visit Number  8    Number of Visits  17    Date for PT Re-Evaluation  09/12/17    Authorization Type  UHC     Authorization Time Period  07/14/17  to  09/12/17    Authorization - Visit Number  8    Authorization - Number of Visits  46    PT Start Time  4562 pt late    PT Stop Time  0920 pt had work    PT Time Calculation (min)  25 min    Activity Tolerance  Patient tolerated treatment well    Behavior During Therapy  [facility] for tasks assessed/performed       Past Medical History:  Diagnosis Date  . Breast hypertrophy 05/2014  . Dental crowns present   . Hx of bilateral breast reduction surgery 07/31/2014  . Idiopathic intracranial hypertension   . Pseudotumor cerebri   . S/P tooth extraction 06/04/2014    Past Surgical History:  Procedure Laterality Date  . BREAST REDUCTION SURGERY Bilateral 06/11/2014   Procedure: BILATERAL BREAST REDUCTION ;  Surgeon: Charlene Brooke, MD;  Location: [facility];  Service: Plastics;  Laterality: Bilateral;  . CESAREAN SECTION     x 2  . REDUCTION MAMMAPLASTY Bilateral     There were no vitals filed for this visit.  Subjective Assessment - 09/01/17 0856    Subjective  Pt was late 2/2 having to drop off medication to her young son. Pt reported she's performing HEP but not consistently.     Patient Stated Goals  for the knot to ease up and decreased neck pain    Currently in Pain?  Yes    Pain Score  3     Pain Location  Neck    Pain Orientation  Proximal;Left    Pain Descriptors / Indicators  Sore    Pain Radiating Towards  LUE    Pain  Onset  More than a month ago    Pain Frequency  Intermittent    Aggravating Factors   prolonged sitting    Pain Relieving Factors  changing positions and HEP                      [facility] Adult PT Treatment/Exercise - 09/01/17 0904      Manual Therapy   Manual Therapy  Soft tissue mobilization;Manual Traction;Joint mobilization    Manual therapy comments  pt supine with pt reporting pain decr. to 1-2/10 after manual therapy.    Joint Mobilization  See soft tissue for jt mobs details.    Soft tissue mobilization  PT performed PA Grade II-III jt mobs to C2-C7 3x30 sec. bouts. Pt performed B LT stretch with OP on L shoulder 2x30sec. holds.  Pt able to perform Cx flexion, rotation and sidebending in all directions with less pain after session.   Manual Traction  5x30 sec with 30 sec rest             PT Education - 09/01/17 0924    Education provided  Yes    Education Details  PT educated pt on the importance  of continuing HEP as prescribed. PT again educated pt extensively on dry needling. PT discussed adding 2 more visits 2/2 missed appt's and will assess goals at that time.     Person(s) Educated  Patient    Methods  Explanation    Comprehension  Verbalized understanding       PT Short Term Goals - 08/08/17 0919      PT SHORT TERM GOAL #1   Title  Patient will demostrate independence with basic HEP to manage cervical pain and headaches. (Target for all STGs 08/13/17)    Time  4    Period  Weeks    Status  Partially Met      PT SHORT TERM GOAL #2   Title  Patient will demonstrate improved cervical AROM: flexion >=30 degrees, bil lateral flexion >=40 degrees    Time  4    Period  Weeks    Status  Partially Met      PT SHORT TERM GOAL #3   Title  Patient will report pain <= 4/10 at end of PT sessions.     Time  4    Period  Weeks    Status  Achieved        PT Long Term Goals - 07/14/17 1653      PT LONG TERM GOAL #1   Title  Patient will be independent  with updated HEP (Target for LTGs 09/12/17)    Time  8    Period  Weeks    Status  New      PT LONG TERM GOAL #2   Title  Patient will demonstrate improved cervical AROM: flexion 60    Time  8    Period  Weeks    Status  New      PT LONG TERM GOAL #3   Title  Patient will report pain level <= 3/10 at the end of therapy sessions.    Time  8    Period  Weeks    Status  New            Plan - 09/01/17 2130    Clinical Impression Statement  Pt demonstrated progress, as pt's resting pain and pain after PT session significantly less. Pt would continue to benefit from skilled PT to improve neck/UE pain and to progress HEP. Continue with POC.     PT Treatment/Interventions  ADLs/Self Care Home Management;Cryotherapy;Electrical Stimulation;Moist Heat;Traction;Ultrasound;Therapeutic exercise;Neuromuscular re-education;Patient/family education;Manual techniques;Passive range of motion;Dry needling    PT Next Visit Plan  Progress HEP with stretching/strengthening to seated as tolerated; manual techniques, postural exercises with band.     Consulted and Agree with Plan of Care  Patient       Patient will benefit from skilled therapeutic intervention in order to improve the following deficits and impairments:  Decreased range of motion, Increased muscle spasms, Increased fascial restricitons, Pain, Impaired flexibility  Visit Diagnosis: Cervicalgia  Abnormal posture  Other symptoms and signs involving the musculoskeletal system     Problem List Patient Active Problem List   Diagnosis Date Noted  . Snoring 11/26/2014  . Depression headache 11/26/2014  . Hx of bilateral breast reduction surgery 07/31/2014  . Headache(784.0) 01/22/2014  . Meralgia paresthetica 12/17/2013    Maxie Slovacek L 09/01/2017, 9:27 AM  [facility] [address] [place], [place], [address] Phone: [phone]   Fax:  [phone]  Name:  Sherri Alvarado MRN: [id number] Date of Birth: 1965/10/07  Geoffry Paradise, PT,DPT 09/01/17  9:27 AM Phone: [phone] Fax: [phone]

## 2017-09-08 ENCOUNTER — Ambulatory Visit: Payer: 59 | Admitting: Physical Therapy

## 2017-09-09 DIAGNOSIS — H40003 Preglaucoma, unspecified, bilateral: Secondary | ICD-10-CM | POA: Diagnosis not present

## 2017-10-11 ENCOUNTER — Ambulatory Visit: Payer: 59 | Admitting: Neurology

## 2017-10-25 ENCOUNTER — Encounter: Payer: Self-pay | Admitting: Neurology

## 2017-10-25 ENCOUNTER — Ambulatory Visit (INDEPENDENT_AMBULATORY_CARE_PROVIDER_SITE_OTHER): Payer: 59 | Admitting: Neurology

## 2017-10-25 VITALS — BP 100/65 | HR 66 | Ht 64.0 in | Wt 223.0 lb

## 2017-10-25 DIAGNOSIS — G44049 Chronic paroxysmal hemicrania, not intractable: Secondary | ICD-10-CM | POA: Insufficient documentation

## 2017-10-25 DIAGNOSIS — G932 Benign intracranial hypertension: Secondary | ICD-10-CM

## 2017-10-25 DIAGNOSIS — M542 Cervicalgia: Secondary | ICD-10-CM

## 2017-10-25 DIAGNOSIS — E669 Obesity, unspecified: Secondary | ICD-10-CM

## 2017-10-25 DIAGNOSIS — G43109 Migraine with aura, not intractable, without status migrainosus: Secondary | ICD-10-CM

## 2017-10-25 DIAGNOSIS — L509 Urticaria, unspecified: Secondary | ICD-10-CM | POA: Diagnosis not present

## 2017-10-25 HISTORY — DX: Migraine with aura, not intractable, without status migrainosus: G43.109

## 2017-10-25 MED ORDER — SUMATRIPTAN SUCCINATE 100 MG PO TABS: 100.0000 mg | ORAL_TABLET | Freq: Once | ORAL | 2 refills | Status: DC | PRN

## 2017-10-25 NOTE — Progress Notes (Signed)
PATIENT: Sherri Alvarado DOB: January 10, 1966  REASON FOR VISIT: follow up-pseudotumor cerebri HISTORY FROM: patient, here alone .   HISTORY OF PRESENT ILLNESS: Today is a revisit for this 52 year old afro-american female patient,  10/25/17:  Sherri Alvarado reports that she has had a stressful 2018, and tested positive for several food related allergies including nuts, beef, gluten-wheat. She also broke out in hives which she initially thought was related to exercise.  Her extends her sides of the legs and arms have been covered in a rash.  The rash is no longer itching at this time but worse. EIA-since last induced allergies?  And knees have been found to show signs of osteoarthritis is not surprising: see age and weight. She stopped exercising at the gym between August and the beginning of the new year, and regained weight.  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  This brought on her migraines. weight has been her main risk factor for pseudotumor cerebri and her headaches have promptly gotten worse. Her neck pain responded to PT, and has now gotten worse again.   She asked for an elevated desk to stand at. She needs to address this also with her orthopedist and PCP.  I will concentrate on her Benign intracranial hypertension.  Has had last spinal tap was in 2017. She brought her ophthalmologists records. He did not find papilledema or optic disc palor but reported elevated inner eye pressure/ glaucoma to be watched. Right eye vitreous detachment.      Sherri Alvarado is a 52 year old female with a history of pseudotumor cerebri. She returns today for follow-up. At the last visit she was encouraged to take Topamax 25 mg twice a day consistently. She reports that her headaches have improved. She denies any visual changes. She did follow-up with her ophthalmologist and she reports that her exam was  normal. She states that she was told she had no papilledema. I have not seen the reports from ophthalmology. She states that she's been having some new issues. She states that she has been having neck pain. She states that typically when she is working on her computer she will get pain in the back of the neck and when she goes to move her neck she'll have some discomfort. She also hears some "cracking." She denies any numbness or tingling down the arms. No pain in the arms. She reports that she also is having some left hip pain. She states that times it feels as if she has a "crick" in her hip. She states that she also has some "cracking" in the knees. She plans to follow-up with her primary care for these things. She states that she also has found that when she exercises that she develops hives all over her chest. She did discuss this with her primary care but decided to make an appointment with an allergist who she will see in October. She reports that she is taking gabapentin 100 mg 3 times a day. As of now she's not really seen a big change. She reports that she still has numbness in the right thigh typically bedtime. She returns today for an evaluation    HISTORY 03/22/17:Today 03/22/17  Sherri Alvarado is a 53 year old female with a history of pseudotumor cerebri. She returns today for follow-up. She states that she has not been taking Topamax consistently. She states that she typically takes it when the  headaches start back up. She states that she tends to go periods with no headaches then headaches may return. She does see her ophthalmologist Thursday. She also notes that she has been under more stress lately and unsure if this has any effect on her headaches. She continues to try to diet and exercise. She denies any changes with her vision. She also reports that her eczema has been flaring up. She returns today for an evaluation.  HISTORY 11/10/16: Sherri Alvarado is a 52 year old female with a history  of pseudotumor cerebri and meralgia paresthetica. She returns today for follow-up. She states in the last 2-3 months her headache frequency has increased. She states that she has headache 2-3 times a week. She typically wakes up with the headache. She did switch Topamax to the AM-25 mg.She states occasionally she will take a second dose but overall she is only been doing 25 mg daily. The patient had a follow-up last week with her ophthalmologist and she was put on eyedrops for glaucoma she reports that her ophthalmologist said that she had no swelling related to pseudotumor cerebri. The patient has also been losing weight. She states that she has lost approximately 20 pounds in the last month and a half. She states that she is trying to lose weight. She is also exercising. She's had a sleep study in the past that was unremarkable. She states that in the last several weeks she's also been having numbness again on the right thigh. She denies any pain. She returns today for an evaluation.    REVIEW OF SYSTEMS: Out of a complete 14 system review of symptoms, the patient complains only of the following symptoms, and all other reviewed systems are negative.  ALLERGIES: Allergies  Allergen Reactions  . Flour Shortness Of Breath  . Banana Other (See Comments)    TINGLING OF TONGUE  . Codeine Itching  . Other     TREE NUTS  . Wheat Bran     HOME MEDICATIONS: Outpatient Medications Prior to Visit  Medication Sig Dispense Refill  . ALPRAZolam (XANAX) 1 MG tablet as needed.    Marland Kitchen EPINEPHrine (AUVI-Q) 0.3 mg/0.3 mL IJ SOAJ injection Use as directed for severe allergic reaction 2 Device 1  . gabapentin (NEURONTIN) 100 MG capsule Take 2 capsules (200 mg total) by mouth 3 (three) times daily. 180 capsule 5  . latanoprost (XALATAN) 0.005 % ophthalmic solution     . Multiple Vitamin (MULTIVITAMIN) tablet Take 1 tablet by mouth daily.    . Omega-3 Fatty Acids (FISH OIL) 600 MG CAPS Take 1 capsule by mouth.     . topiramate (TOPAMAX) 25 MG tablet Take 2 tablets (50 mg total) by mouth at bedtime. 180 tablet 3  . triamcinolone cream (KENALOG) 0.1 %     . Crisaborole (EUCRISA) 2 % OINT Apply 1 application topically 2 (two) times daily as needed. 60 g 5  . diclofenac (VOLTAREN) 75 MG EC tablet     . escitalopram (LEXAPRO) 10 MG tablet      No facility-administered medications prior to visit.     PAST MEDICAL HISTORY: Past Medical History:  Diagnosis Date  . Breast hypertrophy 05/2014  . Dental crowns present   . Hx of bilateral breast reduction surgery 07/31/2014  . Idiopathic intracranial hypertension   . Pseudotumor cerebri   . S/P tooth extraction 06/04/2014    PAST SURGICAL HISTORY: Past Surgical History:  Procedure Laterality Date  . BREAST REDUCTION SURGERY Bilateral 06/11/2014   Procedure:  BILATERAL BREAST REDUCTION ;  Surgeon: Karie Fetch, MD;  Location: [facility];  Service: Plastics;  Laterality: Bilateral;  . CESAREAN SECTION     x 2  . REDUCTION MAMMAPLASTY Bilateral     FAMILY HISTORY: Family History  Problem Relation Age of Onset  . Multiple sclerosis Mother   . Heart murmur Mother   . Hypertension Mother   . Asthma Mother   . Food Allergy Mother        pepper  . Eczema Mother   . Kidney disease Father   . Cancer - Other Father        throat  . Eczema Father   . Asthma Maternal Aunt   . Asthma Maternal Grandmother   . Allergic rhinitis Neg Hx   . Angioedema Neg Hx   . Atopy Neg Hx   . Immunodeficiency Neg Hx   . Urticaria Neg Hx     SOCIAL HISTORY: Social History   Socioeconomic History  . Marital status: Divorced    Spouse name: Not on file  . Number of children: 2  . Years of education: 9  . Highest education level: Not on file  Social Needs  . Financial resource strain: Not on file  . Food insecurity - worry: Not on file  . Food insecurity - inability: Not on file  . Transportation needs - medical: Not on file  .  Transportation needs - non-medical: Not on file  Occupational History    Employer: [employer]  Tobacco Use  . Smoking status: Never Smoker  . Smokeless tobacco: Never Used  Substance and Sexual Activity  . Alcohol use: Yes    Alcohol/week: 0.0 oz    Comment: occasionally  . Drug use: No    Comment: last used 04/2014  . Sexual activity: Not on file  Other Topics Concern  . Not on file  Social History Narrative   Separated, 2 children   Right handed   3 yrs of college   Patient drinks very little caffeine.      PHYSICAL EXAM  Vitals:   10/25/17 0922  BP: 100/65  Pulse: 66  Weight: 223 lb (101.2 kg)  Height: 5\' 4"  (1.626 m)   Body mass index is 38.28 kg/m. Mental Status: Alert, oriented, thought content appropriate.  Speech fluent without evidence of aphasia. Able to follow 3 step commands without difficulty. Cranial Nerves: noted hypersensitivity to smells, but intact taste.  II-Discs flat bilaterally. Visual fields grossly intact. III/IV/VI-Extraocular movements intact.  Pupils reactive bilaterally. V/VII-Smile symmetric VIII-grossly intact IX/X-normal gag XI-bilateral shoulder shrug XII-midline tongue extension Motor: 5/5 bilaterally with normal tone and bulk, grip strength, no change in handwriting coordination to fine motor skills is intact. Sensory: Pinprick and light touch intact throughout, bilaterally.  Deep Tendon Reflexes: 2+ and symmetric throughout, Plantars: Downgoing bilaterally Cerebellar: Normal finger-to-nose, normal rapid alternating movements and normal gait and station.    DIAGNOSTIC DATA (LABS, IMAGING, TESTING) - I reviewed patient records, labs, notes, testing and imaging myself where available.  Lab Results  Component Value Date   HGB 11.9 (L) 06/11/2014   Reviewed her ophthalmological records from Dr Wonda Cheng, and [facility], Dr. Edrick Oh,  MD 03-24-17. Allergist Dr. Margo Aye, MD at the allergy and asthma center of  [facility],  [facility].  ASSESSMENT AND PLAN 52 y.o. year old female  has a past medical history of Breast hypertrophy (05/2014), Dental crowns present, bilateral breast reduction surgery (07/31/2014),  Idiopathic intracranial hypertension, Pseudotumor cerebri, and S/P tooth extraction (06/04/2014). here with:  1. Pseudotumor cerebri -she saw her ophthalmologist in July 2018 when no optic disc pallor or retinal swelling was noted.  However she had just stopped exercising at this point and has gained weight since. 2. Migraine-continued topiramate due to cognitive delay, affecting her performance at work.   I will order Imitrex for her. 3. Weight gain after she discontinued was exercising regularly, she has now been back exercising since January, is slowly losing weight again 4. Neck pain, responded to physical therapy. 5.  Unexplained skin rash, Allergologist believes there is an exercise-induced Urticaria.  She also was tested for food allergies and tested positive for beef, gluten-wheat, and nutts.  Feels much better when she adheres to that tired and I urged her to do so.   The patient will change to   XR form Topamax once  a day. Her headaches had improved since taking this medication, but she had cognitive side effects.   she currently does not have any visual disturbances. Ophthalmology found no papilledema- we can repeat LP and see if ICP is elevated. Based on that either  Diamox or TPM increase. Imitrex.    She is reporting a recurrent symptom of neck pain. Physical  Therapy with exercises at home has helped- needs to resume .   I will support the patient's request for a standing desk at work, as this may help her posture neck pain and even her headaches to some degree.  I do not know if she can maintain this posture better given that she has hip and knee osteo arthritis.   RV with Butch Penny, NP  in 3-4 month and yearly after.   Melvyn Novas, MD  10/25/2017, 9:42 AM    Fellow of ABPN, ABSM and AASM member  [facility] [address], [address] [place], [place] [address] [phone]

## 2017-10-26 ENCOUNTER — Ambulatory Visit: Payer: 59 | Admitting: Neurology

## 2017-10-26 ENCOUNTER — Telehealth: Payer: Self-pay | Admitting: *Deleted

## 2017-10-26 NOTE — Telephone Encounter (Signed)
Pt [employer] form on [employer].

## 2017-10-27 DIAGNOSIS — Z0289 Encounter for other administrative examinations: Secondary | ICD-10-CM

## 2017-11-08 ENCOUNTER — Ambulatory Visit
Admission: RE | Admit: 2017-11-08 | Discharge: 2017-11-08 | Disposition: A | Discharge status: Home / Self Care | Payer: 59 | Source: Ambulatory Visit | Attending: Neurology | Admitting: Neurology

## 2017-11-08 DIAGNOSIS — G44049 Chronic paroxysmal hemicrania, not intractable: Secondary | ICD-10-CM

## 2017-11-08 DIAGNOSIS — R51 Headache: Secondary | ICD-10-CM | POA: Diagnosis not present

## 2017-11-10 ENCOUNTER — Telehealth: Payer: Self-pay | Admitting: Neurology

## 2017-11-10 ENCOUNTER — Other Ambulatory Visit: Payer: 59

## 2017-11-10 ENCOUNTER — Other Ambulatory Visit: Payer: Self-pay | Admitting: Neurology

## 2017-11-10 NOTE — Telephone Encounter (Signed)
-----   Message from Melvyn Novas, MD sent at 11/09/2017  5:15 PM EST ----- punctate white matter lesions, not typical for MS, rather for microvascular changes. 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

## 2017-11-10 NOTE — Telephone Encounter (Signed)
Called the patient to go over the results and she was concerned about the microvascular changes. I informed her that this is common as we age and with patients that have headaches, high blood pressure and increased blood sugar. The patient doesn't really want to proceed forward with the LP at this time. The patients states that her headaches are minimal at this time and she doesn't want to go through that unless necessary. I informed her I would make Dr Dohmeier aware and that if her headaches worsen to call us and we can always see about reordering it for her at that time. Pt verbalized understanding.

## 2017-11-17 ENCOUNTER — Inpatient Hospital Stay
Admission: RE | Admit: 2017-11-17 | Discharge: 2017-11-17 | Disposition: A | Discharge status: Home / Self Care | Payer: 59 | Source: Ambulatory Visit | Attending: Neurology | Admitting: Neurology

## 2018-01-03 NOTE — Therapy (Signed)
[facility] [address] [place], [place], [address] Phone: [phone]   Fax:  [phone]  Patient Details  Name: Sherri Alvarado MRN: [id number] Date of Birth: 1965-10-03 Referring Provider:  No ref. provider found  Encounter Date: 01/03/2018  PHYSICAL THERAPY DISCHARGE SUMMARY  Visits from Start of Care: 8  Current functional level related to goals / functional outcomes: PT Short Term Goals - 08/08/17 0919      PT SHORT TERM GOAL #1   Title  Patient will demostrate independence with basic HEP to manage cervical pain and headaches. (Target for all STGs 08/13/17)    Time  4    Period  Weeks    Status  Partially Met      PT SHORT TERM GOAL #2   Title  Patient will demonstrate improved cervical AROM: flexion >=30 degrees, bil lateral flexion >=40 degrees    Time  4    Period  Weeks    Status  Partially Met      PT SHORT TERM GOAL #3   Title  Patient will report pain <= 4/10 at end of PT sessions.     Time  4    Period  Weeks    Status  Achieved      PT Long Term Goals - 07/14/17 1653      PT LONG TERM GOAL #1   Title  Patient will be independent with updated HEP (Target for LTGs 09/12/17)    Time  8    Period  Weeks    Status  New      PT LONG TERM GOAL #2   Title  Patient will demonstrate improved cervical AROM: flexion 60    Time  8    Period  Weeks    Status  New      PT LONG TERM GOAL #3   Title  Patient will report pain level <= 3/10 at the end of therapy sessions.    Time  8    Period  Weeks    Status  New         Remaining deficits: Unknown, as pt never returned to PT.   Education / Equipment: HEP  Plan: Patient agrees to discharge.  Patient goals were not met. Patient is being discharged due to not returning since the last visit.  ?????       Miller,Jennifer L 01/03/2018, 10:14 AM  [facility] [address] [place], [place], [address] Phone: [phone]   Fax:  [phone]   Geoffry Paradise, PT,DPT 01/03/18 10:14 AM Phone: [phone] Fax: [phone]

## 2018-02-07 ENCOUNTER — Ambulatory Visit: Payer: 59 | Admitting: Adult Health

## 2018-03-10 DIAGNOSIS — H40003 Preglaucoma, unspecified, bilateral: Secondary | ICD-10-CM | POA: Diagnosis not present

## 2018-05-04 DIAGNOSIS — Z0000 Encounter for general adult medical examination without abnormal findings: Secondary | ICD-10-CM | POA: Diagnosis not present

## 2018-05-04 DIAGNOSIS — Z13220 Encounter for screening for lipoid disorders: Secondary | ICD-10-CM | POA: Diagnosis not present

## 2018-05-04 DIAGNOSIS — L659 Nonscarring hair loss, unspecified: Secondary | ICD-10-CM | POA: Diagnosis not present

## 2018-05-17 ENCOUNTER — Ambulatory Visit: Payer: 59 | Admitting: Neurology

## 2018-06-02 ENCOUNTER — Other Ambulatory Visit: Payer: Self-pay | Admitting: Adult Health

## 2018-06-13 ENCOUNTER — Other Ambulatory Visit: Payer: Self-pay | Admitting: Family Medicine

## 2018-06-13 DIAGNOSIS — Z1239 Encounter for other screening for malignant neoplasm of breast: Secondary | ICD-10-CM

## 2018-07-06 DIAGNOSIS — M25551 Pain in right hip: Secondary | ICD-10-CM | POA: Diagnosis not present

## 2018-07-19 ENCOUNTER — Ambulatory Visit: Payer: 59

## 2018-07-27 ENCOUNTER — Ambulatory Visit
Admission: RE | Admit: 2018-07-27 | Discharge: 2018-07-27 | Disposition: A | Discharge status: Home / Self Care | Payer: 59 | Source: Ambulatory Visit | Attending: Family Medicine | Admitting: Family Medicine

## 2018-07-27 DIAGNOSIS — Z1231 Encounter for screening mammogram for malignant neoplasm of breast: Secondary | ICD-10-CM | POA: Diagnosis not present

## 2018-07-27 DIAGNOSIS — Z1239 Encounter for other screening for malignant neoplasm of breast: Secondary | ICD-10-CM

## 2018-08-16 ENCOUNTER — Ambulatory Visit: Payer: 59 | Admitting: Neurology

## 2018-08-18 DIAGNOSIS — L308 Other specified dermatitis: Secondary | ICD-10-CM | POA: Diagnosis not present

## 2018-10-02 ENCOUNTER — Ambulatory Visit (INDEPENDENT_AMBULATORY_CARE_PROVIDER_SITE_OTHER): Payer: 59 | Admitting: Neurology

## 2018-10-02 ENCOUNTER — Encounter: Payer: Self-pay | Admitting: Neurology

## 2018-10-02 VITALS — BP 124/67 | HR 82 | Ht 65.0 in | Wt 241.0 lb

## 2018-10-02 DIAGNOSIS — I776 Arteritis, unspecified: Secondary | ICD-10-CM | POA: Diagnosis not present

## 2018-10-02 DIAGNOSIS — G932 Benign intracranial hypertension: Secondary | ICD-10-CM | POA: Diagnosis not present

## 2018-10-02 MED ORDER — ALPRAZOLAM 0.5 MG PO TABS: 0.5000 mg | ORAL_TABLET | Freq: Every evening | ORAL | 0 refills | Status: DC | PRN

## 2018-10-02 NOTE — Progress Notes (Signed)
PATIENT: Sherri StanleyVeronica S Alvarado DOB: [birth year]  REASON FOR VISIT: follow up-pseudotumor cerebri, mildly abnormal MRI brain.   HISTORY FROM: patient, here alone.    Today is a revisit for this 53 year old afro-american female patient, on 10/02/18: I have the pleasure of meeting [name] today she is a meanwhile 53 year old African-American patient had presented with chronic paroxysmal hemicrania and a history of pseudotumor cerebri.  I had ordered an MRI and spinal tap for the patient performed in February 2019, at the time she did not want fluid to be taken and rather pursue weight loss.  Unfortunately her weight loss plans have not fulfilled and we are now meeting with the patient that still has occasional headaches, some some pain in her right leg and some numbness sensation in the left arm stiffness and numbness around the neck are also described.  Given that her neuroimaging report from February 2019 showed only slight abnormalities nonspecific bilateral, subcortical, tiny white matter hyperintensities there is a vasculitis differential diagnosis to be resolved also we should look at demyelination.  The patient has no history history of an autoimmune disease, DM or CAD.    HISTORY OF PRESENT ILLNESS: 10-2016 [name] reports that she has had a stressful 2018, and tested positive for several food related allergies including nuts, beef, gluten-wheat. She also broke out in hives which she initially thought was related to exercise.  Her extends her sides of the legs and arms have been covered in a rash.  The rash is no longer itching at this time but worse. EIA-since last induced allergies?  And knees have been found to show signs of osteoarthritis is not surprising: see age and weight. She stopped exercising at the gym between August and the beginning of the new year, and regained weight.  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  This brought on her migraines. weight has been her main risk factor for pseudotumor cerebri and her headaches have promptly gotten worse. Her neck pain responded to PT, and has now gotten worse again.   She asked for an elevated desk to stand at. She needs to address this also with her orthopedist and PCP.  I will concentrate on her Benign intracranial hypertension.  Has had last spinal tap was in 2017. She brought her ophthalmologists records. He did not find papilledema or optic disc palor but reported elevated inner eye pressure/ glaucoma to be watched. Right eye vitreous detachment.      HISTORY 11/10/16: [name] is a 53 year old female with a history of pseudotumor cerebri and meralgia paresthetica. She returns today for follow-up. She states in the last 2-3 months her headache frequency has increased. She states that she has headache 2-3 times a week. She typically wakes up with the headache. She did switch Topamax to the AM-25 mg.She states occasionally she will take a second dose but overall she is only been doing 25 mg daily. The patient had a follow-up last week with her ophthalmologist and she was put on eyedrops for glaucoma she reports that her ophthalmologist said that she had no swelling related to pseudotumor cerebri. The patient has also been losing weight. She states that she has lost approximately 20 pounds in the last month and a half. She states that she is trying to lose weight. She is also exercising. She's had a sleep study in the past that was  unremarkable. She states that in the last several weeks she's also been having numbness again on the right thigh. She denies any pain. She returns today for an evaluation.    REVIEW OF SYSTEMS: Out of a complete 14 system review of symptoms, the patient complains only of the following symptoms, and all other reviewed systems are negative.   Knee pain, joint pain, obesity,  numbness, and headaches.  ALLERGIES: Allergies  Allergen Reactions  . Flour Shortness Of Breath  . Banana Other (See Comments)    TINGLING OF TONGUE  . Codeine Itching  . Other     TREE NUTS  . Wheat Bran     HOME MEDICATIONS: Outpatient Medications Prior to Visit  Medication Sig Dispense Refill  . ALPRAZolam (XANAX) 1 MG tablet as needed.    Marland Kitchen EPINEPHrine (AUVI-Q) 0.3 mg/0.3 mL IJ SOAJ injection Use as directed for severe allergic reaction 2 Device 1  . gabapentin (NEURONTIN) 100 MG capsule TAKE 1 CAPSULE BY MOUTH  THREE TIMES A DAY 180 capsule 5  . latanoprost (XALATAN) 0.005 % ophthalmic solution     . Multiple Vitamin (MULTIVITAMIN) tablet Take 1 tablet by mouth daily.    . Omega-3 Fatty Acids (FISH OIL) 600 MG CAPS Take 1 capsule by mouth.    . SUMAtriptan (IMITREX) 100 MG tablet Take 1 tablet (100 mg total) by mouth once as needed for up to 1 dose for migraine. May repeat in 2 hours if headache persists or recurs. 10 tablet 2  . triamcinolone cream (KENALOG) 0.1 %      No facility-administered medications prior to visit.     PAST MEDICAL HISTORY: Past Medical History:  Diagnosis Date  . Breast hypertrophy 05/2014  . Dental crowns present   . Hx of bilateral breast reduction surgery 07/31/2014  . Idiopathic intracranial hypertension   . Migraine equivalent syndrome 10/25/2017  . Pseudotumor cerebri   . S/P tooth extraction 06/04/2014    PAST SURGICAL HISTORY: Past Surgical History:  Procedure Laterality Date  . BREAST REDUCTION SURGERY Bilateral 06/11/2014   Procedure: BILATERAL BREAST REDUCTION ;  Surgeon: Karie Fetch, MD;  Location: [facility];  Service: Plastics;  Laterality: Bilateral;  . CESAREAN SECTION     x 2  . REDUCTION MAMMAPLASTY Bilateral     FAMILY HISTORY: Family History  Problem Relation Age of Onset  . Multiple sclerosis Mother   . Heart murmur Mother   . Hypertension Mother   . Asthma Mother   . Food Allergy Mother         pepper  . Eczema Mother   . Kidney disease Father   . Cancer - Other Father        throat  . Eczema Father   . Asthma Maternal Aunt   . Asthma Maternal Grandmother   . Allergic rhinitis Neg Hx   . Angioedema Neg Hx   . Atopy Neg Hx   . Immunodeficiency Neg Hx   . Urticaria Neg Hx     SOCIAL HISTORY: Social History   Socioeconomic History  . Marital status: Divorced    Spouse name: Not on file  . Number of children: 2  . Years of education: 6  . Highest education level: Not on file  Occupational History    Employer: [employer]  Social Needs  . Financial resource strain: Not on file  . Food insecurity:    Worry: Not on file    Inability: Not on file  . Transportation  needs:    Medical: Not on file    Non-medical: Not on file  Tobacco Use  . Smoking status: Never Smoker  . Smokeless tobacco: Never Used  Substance and Sexual Activity  . Alcohol use: Yes    Alcohol/week: 0.0 standard drinks    Comment: occasionally  . Drug use: No    Types: Marijuana    Comment: last used 04/2014  . Sexual activity: Not on file  Lifestyle  . Physical activity:    Days per week: Not on file    Minutes per session: Not on file  . Stress: Not on file  Relationships  . Social connections:    Talks on phone: Not on file    Gets together: Not on file    Attends religious service: Not on file    Active member of club or organization: Not on file    Attends meetings of clubs or organizations: Not on file    Relationship status: Not on file  . Intimate partner violence:    Fear of current or ex partner: Not on file    Emotionally abused: Not on file    Physically abused: Not on file    Forced sexual activity: Not on file  Other Topics Concern  . Not on file  Social History Narrative   Separated, 2 children   Right handed   3 yrs of college   Patient drinks very little caffeine.      PHYSICAL EXAM  Vitals:   10/02/18 1545  BP: 124/67  Pulse: 82  Weight: 241  lb (109.3 kg)  Height: 5\' 5"  (1.651 m)   Body mass index is 40.1 kg/m.   Mental Status: Alert, oriented, thought content appropriate.  Speech fluent without evidence of aphasia. Able to follow 3 step commands without difficulty. Cranial Nerves: noted hypersensitivity to smells, but intact taste.  II-Discs flat bilaterally. Visual fields intact.-Extraocular movements intact.  No diplopia. Pupils reactive bilaterally.Smile is symmetric, uvula midline, hearing intact. Normal bilateral shoulder shrug, midline tongue extension Motor: 5/5 bilaterally with normal tone and bulk, grip strength, no change in handwriting coordination to fine motor skills is intact. Sensory: Pinprick and light touch intact throughout, bilaterally.  Deep Tendon Reflexes: 2+ and symmetric throughout, no clonus.  Plantars: Downgoing bilaterally Cerebellar: no falls, no changes in handwriting.  Normal finger-to-nose, normal rapid alternating movements and normal gait and station.  DIAGNOSTIC DATA (LABS, IMAGING, TESTING) - I reviewed patient records, labs, notes, testing and imaging myself where available.  Lab Results  Component Value Date   HGB 11.9 (L) 06/11/2014   Reviewed her ophthalmological records from Dr Wonda ChengScott OD, and [facility], Dr. Edrick OhING,  MD 03-24-17. Allergist Dr. Margo AyeShaylar Padgett, MD at [facility]  [place],  [facility]  ASSESSMENT AND PLAN 53 y.o. year old female  has a past medical history of Breast hypertrophy (05/2014), Dental crowns present, bilateral breast reduction surgery (07/31/2014), Idiopathic intracranial hypertension, Migraine equivalent syndrome (10/25/2017), Pseudotumor cerebri, and S/P tooth extraction (06/04/2014). here with:  1. Pseudotumor cerebri -she saw her ophthalmologist in August l 2019  when no optic disc pallor or retinal swelling was noted.   However she had just stopped exercising at this point and has gained weight since.  2.  Migraine-discontinued topiramate due to cognitive delay, affecting her performance at work.  I will order Imitrex for her.    Plan:  she currently does not have any visual disturbances. Ophthalmology found no  papilledema- we can repeat LP and see if ICP is elevated.  She has been reluctant to allow this spinal tap.  I will order a vasculitis panel.  I would strongly suggest a repeat spinal tap for oiligoclonals, protein and cells/ dif. , glucose.  Serum to compare.    CSF with opening pressure, too.   RV with  NP in 3-4 month and yearly after.   Melvyn Novas, MD  10/02/2018, 4:23 PM   Fellow of ABPN, ABSM and AASM member  [facility] [address] [place], [place] [address] [phone]

## 2018-10-02 NOTE — Addendum Note (Signed)
Addended by: Melvyn NovasHMEIER, Gurkaran Rahm on: 10/02/2018 04:42 PM   Modules accepted: Orders

## 2018-10-03 ENCOUNTER — Other Ambulatory Visit: Payer: Self-pay | Admitting: Neurology

## 2018-10-03 ENCOUNTER — Telehealth: Payer: Self-pay | Admitting: Neurology

## 2018-10-03 DIAGNOSIS — Z0289 Encounter for other administrative examinations: Secondary | ICD-10-CM

## 2018-10-03 MED ORDER — ALPRAZOLAM 0.5 MG PO TABS: 0.5000 mg | ORAL_TABLET | Freq: Every evening | ORAL | 0 refills | Status: DC | PRN

## 2018-10-03 NOTE — Addendum Note (Signed)
Addended by: Judi Cong on: 10/03/2018 10:47 AM   Modules accepted: Orders

## 2018-10-03 NOTE — Telephone Encounter (Signed)
[employer] Auth: [id number] (exp. 10/03/18 to 11/17/18) order sent to [facility]. They will reach out to the pt to schedule.

## 2018-10-03 NOTE — Telephone Encounter (Signed)
Patient is aware of the MRI.. patient informed me she has her LP schedule this Friday at [facility]. But the Xanax that she is suppose to use for the MRI and LP was sent to her mail order [facility] and it needs to go to her pharmacy. She said that there needs to be a stop to that order and marked urgent.

## 2018-10-03 NOTE — Telephone Encounter (Signed)
I have reordered the RX and routed to Dr Dohmeier to send to the local pharmacy so the patient can get it filled before her procedure scheduled.

## 2018-10-04 LAB — COMPREHENSIVE METABOLIC PANEL
ALBUMIN: 4.5 g/dL (ref 3.8–4.9)
ALT: 12 IU/L (ref 0–32)
AST: 18 IU/L (ref 0–40)
Albumin/Globulin Ratio: 1.7 (ref 1.2–2.2)
Alkaline Phosphatase: 77 IU/L (ref 39–117)
BUN / CREAT RATIO: 9 (ref 9–23)
BUN: 9 mg/dL (ref 6–24)
Bilirubin Total: 0.3 mg/dL (ref 0.0–1.2)
CO2: 22 mmol/L (ref 20–29)
Calcium: 9.5 mg/dL (ref 8.7–10.2)
Chloride: 110 mmol/L — ABNORMAL HIGH (ref 96–106)
Creatinine, Ser: 0.98 mg/dL (ref 0.57–1.00)
GFR calc Af Amer: 76 mL/min/{1.73_m2} (ref >59)
GFR calc non Af Amer: 66 mL/min/{1.73_m2} (ref >59)
Globulin, Total: 2.7 g/dL (ref 1.5–4.5)
Glucose: 84 mg/dL (ref 65–99)
Potassium: 4.4 mmol/L (ref 3.5–5.2)
SODIUM: 148 mmol/L — AB (ref 134–144)
Total Protein: 7.2 g/dL (ref 6.0–8.5)

## 2018-10-04 LAB — PROTEIN ELECTROPHORESIS, SERUM
A/G Ratio: 1.6 (ref 0.7–1.7)
Albumin ELP: 4.4 g/dL (ref 2.9–4.4)
Alpha 1: 0.2 g/dL (ref 0.0–0.4)
Alpha 2: 0.6 g/dL (ref 0.4–1.0)
Beta: 1 g/dL (ref 0.7–1.3)
GLOBULIN, TOTAL: 2.8 g/dL (ref 2.2–3.9)
Gamma Globulin: 0.9 g/dL (ref 0.4–1.8)

## 2018-10-04 LAB — SJOGRENS SYNDROME-B EXTRACTABLE NUCLEAR ANTIBODY

## 2018-10-04 LAB — ANGIOTENSIN CONVERTING ENZYME: Angio Convert Enzyme: 45 U/L (ref 14–82)

## 2018-10-04 LAB — ANA W/REFLEX IF POSITIVE: Anti Nuclear Antibody(ANA): NEGATIVE

## 2018-10-04 LAB — SJOGRENS SYNDROME-A EXTRACTABLE NUCLEAR ANTIBODY

## 2018-10-05 ENCOUNTER — Telehealth: Payer: Self-pay | Admitting: Neurology

## 2018-10-05 NOTE — Telephone Encounter (Signed)
Error

## 2018-10-05 NOTE — Telephone Encounter (Signed)
FMLA papers for the patient through [employer] completed and given to Stanton Kidney to fax in for the patient.

## 2018-10-06 ENCOUNTER — Encounter: Payer: Self-pay | Admitting: Neurology

## 2018-10-06 ENCOUNTER — Ambulatory Visit
Admission: RE | Admit: 2018-10-06 | Discharge: 2018-10-06 | Disposition: A | Discharge status: Home / Self Care | Payer: 59 | Source: Ambulatory Visit | Attending: Neurology | Admitting: Neurology

## 2018-10-06 VITALS — BP 109/79 | HR 61

## 2018-10-06 DIAGNOSIS — G932 Benign intracranial hypertension: Secondary | ICD-10-CM | POA: Diagnosis not present

## 2018-10-06 DIAGNOSIS — I776 Arteritis, unspecified: Secondary | ICD-10-CM

## 2018-10-06 NOTE — Discharge Instructions (Signed)

## 2018-10-06 NOTE — Progress Notes (Signed)
One SST tube of blood drawn from left AC space for LP labs by Katie Stokes, RN; site unremarkable. 

## 2018-10-09 ENCOUNTER — Encounter: Payer: Self-pay | Admitting: Neurology

## 2018-10-10 ENCOUNTER — Telehealth: Payer: Self-pay | Admitting: Neurology

## 2018-10-10 NOTE — Telephone Encounter (Signed)
-----   Message from Melvyn Novas, MD sent at 10/06/2018  2:54 PM EST ----- All normal CSF results. - glucose, protein and few to no cells. oligoclonals pending.

## 2018-10-10 NOTE — Telephone Encounter (Signed)
Called and reviewed the lab work with the patient as well as the LP results. Informed the patient that so far everything has come back normal and there was nothing of any concern. Pt verbalized understanding. Patient is going to schedule MRI of brain. Advised I will call her with those results once we get them. Advised that the only thing pending was oligoclonal bands from the CSF fluid on the LP. Advised the patient I would call her if they were abnormal. Pt verbalized understanding. Pt had no questions at this time but was encouraged to call back if questions arise.

## 2018-10-13 LAB — CSF CELL COUNT WITH DIFFERENTIAL
RBC Count, CSF: 6 cells/uL (ref 0–10)
WBC, CSF: 1 cells/uL (ref 0–5)

## 2018-10-13 LAB — PROTEIN, CSF: Total Protein, CSF: 33 mg/dL (ref 15–45)

## 2018-10-13 LAB — OLIGOCLONAL BANDS, CSF + SERM

## 2018-10-13 LAB — GLUCOSE, CSF: Glucose, CSF: 56 mg/dL (ref 40–80)

## 2018-10-19 ENCOUNTER — Other Ambulatory Visit: Payer: 59

## 2018-10-20 ENCOUNTER — Ambulatory Visit
Admission: RE | Admit: 2018-10-20 | Discharge: 2018-10-20 | Disposition: A | Discharge status: Home / Self Care | Payer: 59 | Source: Ambulatory Visit | Attending: Neurology | Admitting: Neurology

## 2018-10-20 DIAGNOSIS — I776 Arteritis, unspecified: Secondary | ICD-10-CM | POA: Diagnosis not present

## 2018-10-20 MED ORDER — GADOBENATE DIMEGLUMINE 529 MG/ML IV SOLN
20.0000 mL | Freq: Once | INTRAVENOUS | Status: AC | PRN
  Administered 2018-10-20: 20 mL via INTRAVENOUS

## 2018-10-23 ENCOUNTER — Telehealth: Payer: Self-pay | Admitting: Neurology

## 2018-10-23 NOTE — Telephone Encounter (Signed)
Called the patient to advise her of the MRI results. I advised the patient the MRI was no changes since the previous MRI. Advised the pt that there was no concerns that lead to believe MS was present. Pt verbalized understanding.

## 2018-10-23 NOTE — Telephone Encounter (Signed)
-----   Message from Melvyn Novas, MD sent at 10/23/2018  9:02 AM EST ----- No abnormality since last MRI- microvascular disease- Not MS

## 2018-11-13 NOTE — Telephone Encounter (Signed)
MRI results are printed and being mailed to the address on file.

## 2018-11-13 NOTE — Telephone Encounter (Signed)
Pt caled in and wants to know if MRI results can be mailed out to her. To her current address on file.

## 2018-12-16 IMAGING — DX DG HIP (WITH OR WITHOUT PELVIS) 2-3V*L*
2 series · 2 of 2 positions shown · non-contrast
Comparison: None in PACs

CLINICAL DATA: Lateral left hip pain for the past month with pain
radiating into the left buttock. This symptoms began after the
patient has ceased exercising 5 months ago.

EXAM:
DG HIP (WITH OR WITHOUT PELVIS) 2-3V LEFT

[dg hip unilat w or w/o pelvis 2-3 views  (1 of 2)]
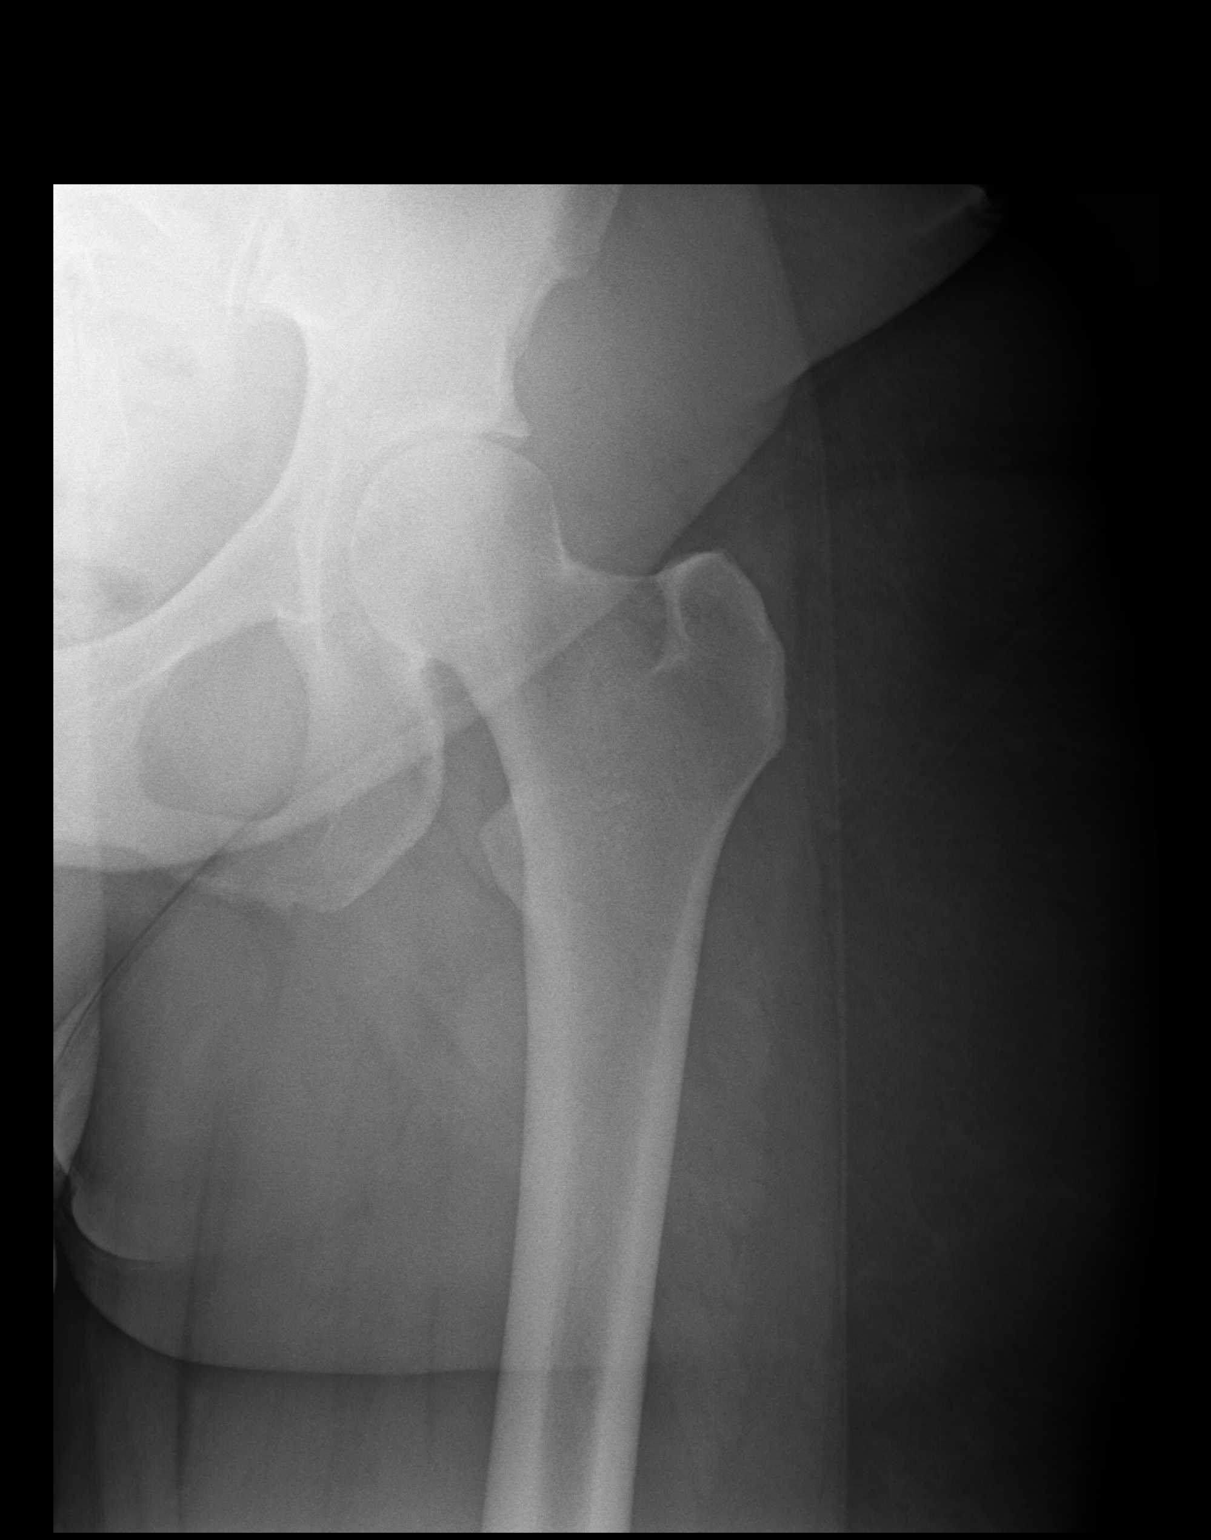

[dg hip unilat w or w/o pelvis 2-3 views  (2 of 2)]
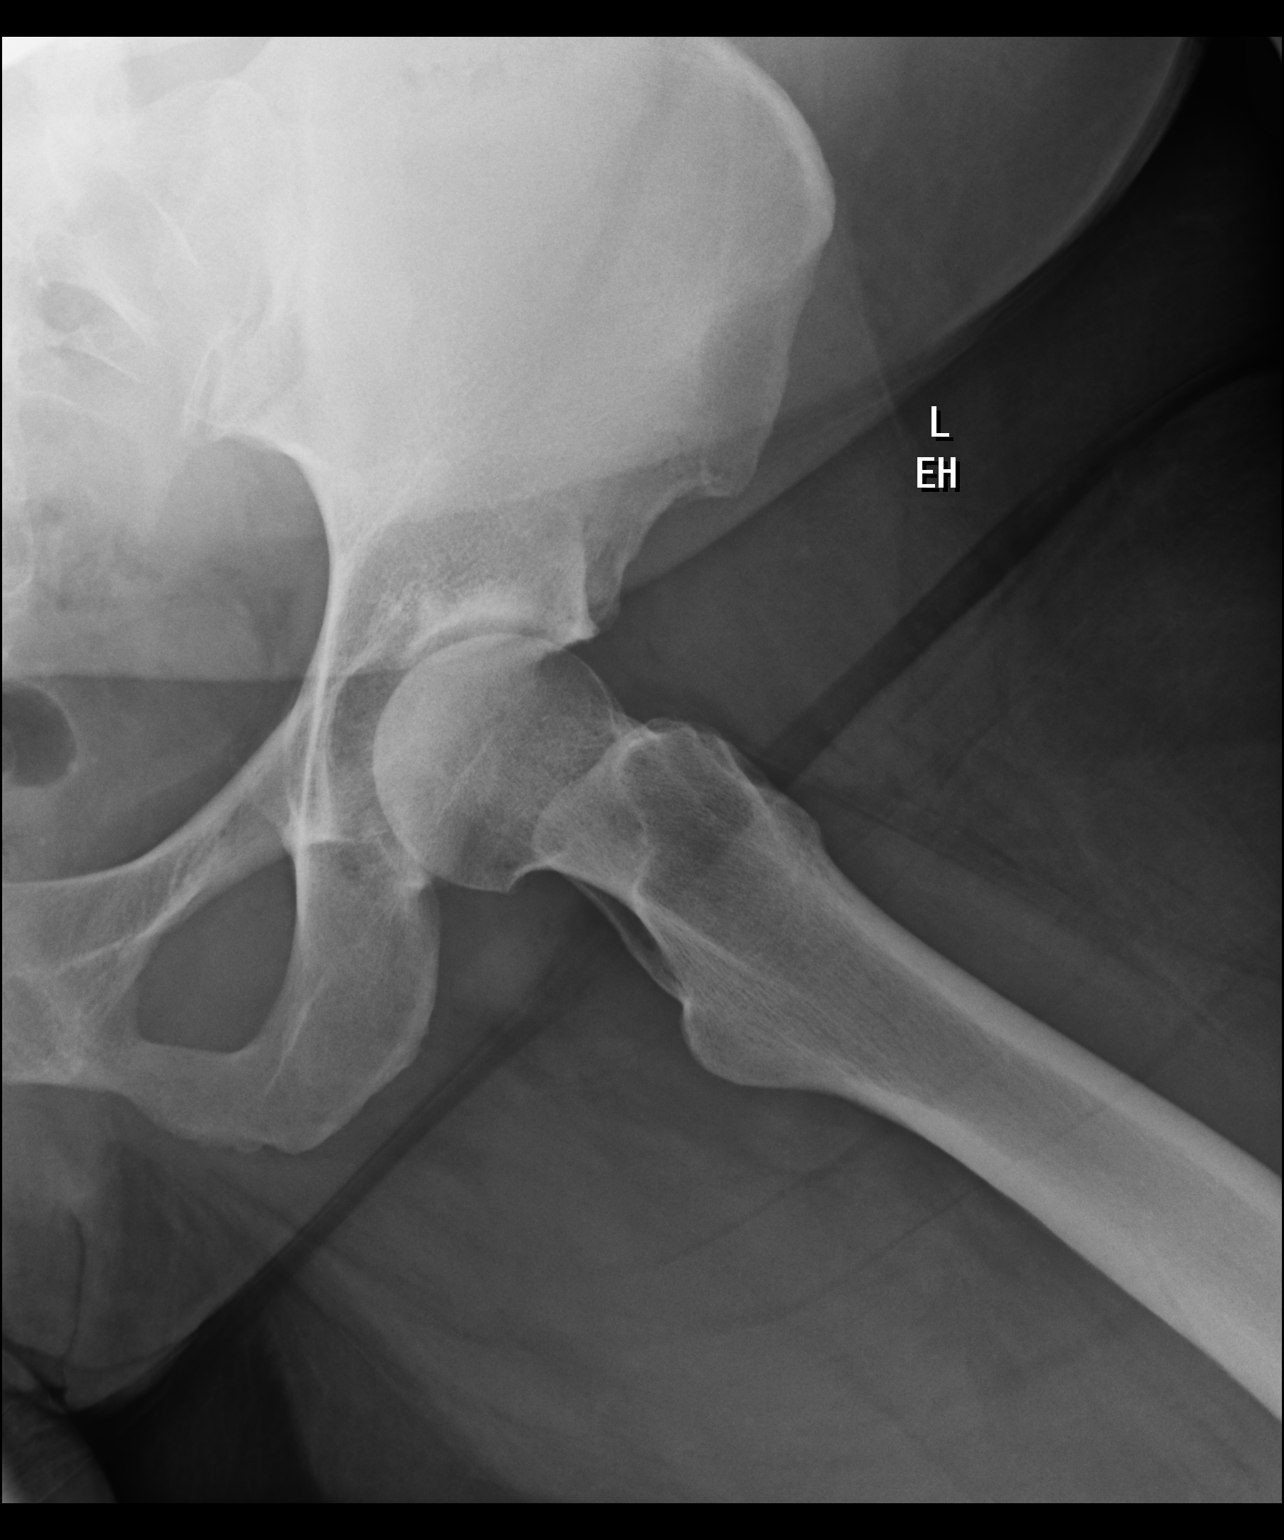

[2 of 2 positions shown; findings below may reference images not displayed]

FINDINGS: The bones are subjectively adequately mineralized. There is mild
narrowing of the superior aspect of the hip joint space. The
articular surfaces of the femoral head and acetabulum remains
smoothly rounded. The femoral neck, intertrochanteric, and
subtrochanteric regions are normal.
IMPRESSION: Mild narrowing of the superior aspect of the left hip joint space
consistent with early osteoarthritic change.

## 2019-04-08 ENCOUNTER — Other Ambulatory Visit: Payer: Self-pay | Admitting: Neurology

## 2019-05-22 ENCOUNTER — Other Ambulatory Visit: Payer: Self-pay | Admitting: Family Medicine

## 2019-05-22 ENCOUNTER — Other Ambulatory Visit (HOSPITAL_COMMUNITY)
Admission: RE | Admit: 2019-05-22 | Discharge: 2019-05-22 | Disposition: A | Discharge status: Home / Self Care | Payer: 59 | Source: Ambulatory Visit | Attending: Family Medicine | Admitting: Family Medicine

## 2019-05-22 DIAGNOSIS — Z124 Encounter for screening for malignant neoplasm of cervix: Secondary | ICD-10-CM | POA: Insufficient documentation

## 2019-05-25 LAB — CYTOLOGY - PAP: Diagnosis: NEGATIVE

## 2019-10-02 ENCOUNTER — Telehealth: Payer: Self-pay

## 2019-10-02 NOTE — Telephone Encounter (Signed)
error 

## 2019-11-20 ENCOUNTER — Ambulatory Visit: Payer: 59 | Admitting: Neurology

## 2019-11-28 ENCOUNTER — Encounter: Payer: Self-pay | Admitting: Neurology

## 2019-11-28 ENCOUNTER — Ambulatory Visit: Payer: 59 | Admitting: Neurology

## 2019-11-28 ENCOUNTER — Ambulatory Visit (INDEPENDENT_AMBULATORY_CARE_PROVIDER_SITE_OTHER): Payer: 59 | Admitting: Neurology

## 2019-11-28 VITALS — BP 137/86 | HR 80 | Temp 96.9°F | Ht 65.0 in | Wt 238.0 lb

## 2019-11-28 DIAGNOSIS — F5104 Psychophysiologic insomnia: Secondary | ICD-10-CM

## 2019-11-28 DIAGNOSIS — Z7189 Other specified counseling: Secondary | ICD-10-CM | POA: Insufficient documentation

## 2019-11-28 DIAGNOSIS — G3184 Mild cognitive impairment, so stated: Secondary | ICD-10-CM

## 2019-11-28 DIAGNOSIS — G43109 Migraine with aura, not intractable, without status migrainosus: Secondary | ICD-10-CM

## 2019-11-28 DIAGNOSIS — G44209 Tension-type headache, unspecified, not intractable: Secondary | ICD-10-CM | POA: Diagnosis not present

## 2019-11-28 MED ORDER — CITALOPRAM HYDROBROMIDE 20 MG PO TABS: 20.0000 mg | ORAL_TABLET | Freq: Every day | ORAL | 5 refills | Status: DC

## 2019-11-28 NOTE — Patient Instructions (Addendum)
ASSESSMENT AND PLAN; here with:  1. Pseudotumor cerebri -last seen  By  ophthalmologist in  Feb 2021. when no optic disc pallor or retinal swelling was noted. There are multiple eye diseases.    2.Obesity-BMI 39.61. However she had just stopped exercising at this point and has gained weight since, COVD isolation doesn't help.  This makes pseudotumor worse and increases risk for OSA. Sleep  Study last negative for apnea- HST.  Exercise and daylight exposure for depression.    3. Migraine-discontinued topiramate due to cognitive delay, affecting her performance at work. - reports her current HA are not migrainous. .   4. MCI - subjectively stated. You are depressed, grieving and  tearfull, very worried about your  job. Let's start SSRI and counseling.   Start with 10 mg a day for 30 days, then increase to 20 mg.

## 2019-11-28 NOTE — Progress Notes (Signed)
PATIENT: Sherri Alvarado DOB: [birth year]  REASON FOR VISIT: follow up-pseudotumor cerebri, mildly abnormal MRI brain.   HISTORY FROM: patient, here alone.    This is a revisit for this 54 year- old afro-american female patient, on 11/28/19:,  Sherri Alvarado contracted Covid in November 2020 had flulike symptoms for about 8 days but recovered quickly without hospitalization.  She reports that her brother however passed away from complications of Covid in December last year year.  She has since struggled with multitasking cognitive abilities and she appears depressed.  As she has mentioned her cognitive problems to my nurse a [place] cognitive assessment was performed today and she scored 27 out of 30 points.  All sleep points she lost word short-term memory.  She was able to multitask year and do visual-spatial path of the test as well as naming objects.  She is fully oriented to place and time and could do subtraction spelling and attention.  She is a 54 y.o. year old AA female has a past medical history of Breast hypertrophy (05/2014), Dental crowns present, bilateral breast reduction surgery (07/31/2014), Idiopathic intracranial hypertension, Migraine equivalent syndrome (10/25/2017), Pseudotumor cerebri, and S/P tooth extraction (06/04/2014). She has smoked marihuana , quit in November with her COVID illness. She started taking xanax for Anxiety , worsened d since December. 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   She is afraid of losing the job. Has insomnia, getting not enough rest. Xanax through PCP (!).   She is here followed for pseudotumor cerebri, and recently was diagnosed with glaucoma, high intraocular pressure , and cataract, had a detached retina in the left eye- needs Ophthalmologist -subspecialist.  headaches are more frequent again, no numbness but a lot of stress.  She is tearful.       10-2018 I have the pleasure of meeting [name] she is a meanwhile 55 year old African-American patient had presented with chronic paroxysmal hemicrania and a history of pseudotumor cerebri.  I had ordered an MRI and spinal tap for the patient performed in February 2019, at the time she did not want fluid to be taken and rather pursue weight loss.  Unfortunately her weight loss plans have not fulfilled and we are now meeting with the patient that still has occasional headaches, some some pain in her right leg and some numbness sensation in the left arm stiffness and numbness around the neck are also described.  Given that her neuroimaging report from February 2019 showed only slight abnormalities nonspecific bilateral, subcortical, tiny white matter hyperintensities there is a vasculitis differential diagnosis to be resolved also we should look at demyelination.  The patient has no history history of an autoimmune disease, DM or CAD.    HISTORY OF PRESENT ILLNESS: 10-2016 [name] reports that she has had a stressful 2018, and tested positive for several food related allergies including nuts, beef, gluten-wheat. She also broke out in hives which she initially thought was related to exercise.  Her extends her sides of the legs and arms have been covered in a rash.  The rash is no longer itching at this time but worse. EIA-since last induced allergies?  And knees have been found to show signs of osteoarthritis is not surprising: see age and weight. She stopped exercising at the gym between August and the beginning of the new year, and regained weight.  Job requires her to make presentations and to be fluently verbal not suffering a  slowed thought process she needed to discontinue Topamax.  This brought on her migraines. weight has been her main risk factor for pseudotumor cerebri and her headaches have promptly gotten worse. Her neck pain responded to PT, and has now gotten worse again.   She asked for an elevated desk to stand at. She needs to  address this also with her orthopedist and PCP.  I will concentrate on her Benign intracranial hypertension.  Has had last spinal tap was in 2017. She brought her ophthalmologists records. He did not find papilledema or optic disc palor but reported elevated inner eye pressure/ glaucoma to be watched. Right eye vitreous detachment.      HISTORY 11/10/16: Sherri Alvarado is a 54 year old female with a history of pseudotumor cerebri and meralgia paresthetica. She returns today for follow-up. She states in the last 2-3 months her headache frequency has increased. She states that she has headache 2-3 times a week. She typically wakes up with the headache. She did switch Topamax to the AM-25 mg.She states occasionally she will take a second dose but overall she is only been doing 25 mg daily. The patient had a follow-up last week with her ophthalmologist and she was put on eyedrops for glaucoma she reports that her ophthalmologist said that she had no swelling related to pseudotumor cerebri. The patient has also been losing weight. She states that she has lost approximately 20 pounds in the last month and a half. She states that she is trying to lose weight. She is also exercising. She's had a sleep study in the past that was unremarkable. She states that in the last several weeks she's also been having numbness again on the right thigh. She denies any pain. She returns today for an evaluation.    REVIEW OF SYSTEMS: Out of a complete 14 system review of symptoms, the patient complains only of the following symptoms, and all other reviewed systems are negative.   Knee pain, arthritis, joint pain, obesity, stress insomnia, anxiety  and headaches.  Grieving,  Tearfully depressed.    ALLERGIES: Allergies  Allergen Reactions  . Banana Other (See Comments)    TINGLING OF TONGUE  . Flour Hives    Also, wheat bran   . Other Hives and Itching    TREE NUTS AND BEEF  . Codeine Itching    HOME  MEDICATIONS: Outpatient Medications Prior to Visit  Medication Sig Dispense Refill  . ALPRAZolam (XANAX) 0.5 MG tablet Take 1 tablet (0.5 mg total) by mouth at bedtime as needed for anxiety (use prior to mri and LP for anxiety). 30 tablet 0  . EPINEPHrine (AUVI-Q) 0.3 mg/0.3 mL IJ SOAJ injection Use as directed for severe allergic reaction 2 Device 1  . gabapentin (NEURONTIN) 100 MG capsule TAKE 1 CAPSULE BY MOUTH 3  TIMES A DAY 270 capsule 3  . latanoprost (XALATAN) 0.005 % ophthalmic solution     . Multiple Vitamin (MULTIVITAMIN) tablet Take 1 tablet by mouth daily.    . Omega-3 Fatty Acids (FISH OIL) 600 MG CAPS Take 1 capsule by mouth.    . triamcinolone cream (KENALOG) 0.1 %     . SUMAtriptan (IMITREX) 100 MG tablet Take 1 tablet (100 mg total) by mouth once as needed for up to 1 dose for migraine. May repeat in 2 hours if headache persists or recurs. 10 tablet 2   No facility-administered medications prior to visit.    PAST MEDICAL HISTORY: Past Medical History:  Diagnosis Date  .  Breast hypertrophy 05/2014  . Dental crowns present   . Hx of bilateral breast reduction surgery 07/31/2014  . Idiopathic intracranial hypertension   . Migraine equivalent syndrome 10/25/2017  . Pseudotumor cerebri   . S/P tooth extraction 06/04/2014    PAST SURGICAL HISTORY: Past Surgical History:  Procedure Laterality Date  . BREAST REDUCTION SURGERY Bilateral 06/11/2014   Procedure: BILATERAL BREAST REDUCTION ;  Surgeon: Karie Fetch, MD;  Location: [facility];  Service: Plastics;  Laterality: Bilateral;  . CESAREAN SECTION     x 2  . REDUCTION MAMMAPLASTY Bilateral     FAMILY HISTORY: Family History  Problem Relation Age of Onset  . Multiple sclerosis Mother   . Heart murmur Mother   . Hypertension Mother   . Asthma Mother   . Food Allergy Mother        pepper  . Eczema Mother   . Kidney disease Father   . Cancer - Other Father        throat  . Eczema Father   .  Asthma Maternal Aunt   . Asthma Maternal Grandmother   . Allergic rhinitis Neg Hx   . Angioedema Neg Hx   . Atopy Neg Hx   . Immunodeficiency Neg Hx   . Urticaria Neg Hx     SOCIAL HISTORY: Social History   Socioeconomic History  . Marital status: Divorced    Spouse name: Not on file  . Number of children: 2  . Years of education: 74  . Highest education level: Not on file  Occupational History    Employer: [employer]  Tobacco Use  . Smoking status: Never Smoker  . Smokeless tobacco: Never Used  Substance and Sexual Activity  . Alcohol use: Yes    Alcohol/week: 0.0 standard drinks    Comment: occasionally  . Drug use: No    Types: Marijuana    Comment: last used 04/2014  . Sexual activity: Not on file  Other Topics Concern  . Not on file  Social History Narrative   Separated, 2 children   Right handed   3 yrs of college   Patient drinks very little caffeine.   Social Determinants of Health   Financial Resource Strain:   . Difficulty of Paying Living Expenses:   Food Insecurity:   . Worried About Programme researcher, broadcasting/film/video in the Last Year:   . Barista in the Last Year:   Transportation Needs:   . Freight forwarder (Medical):   Marland Kitchen Lack of Transportation (Non-Medical):   Physical Activity:   . Days of Exercise per Week:   . Minutes of Exercise per Session:   Stress:   . Feeling of Stress :   Social Connections:   . Frequency of Communication with Friends and Family:   . Frequency of Social Gatherings with Friends and Family:   . Attends Religious Services:   . Active Member of Clubs or Organizations:   . Attends Banker Meetings:   Marland Kitchen Marital Status:   Intimate Partner Violence:   . Fear of Current or Ex-Partner:   . Emotionally Abused:   Marland Kitchen Physically Abused:   . Sexually Abused:     Recent MRI _the patient underwent a MRI of the brain on 20 October 2018, this was ordered because of her changing headache and increasing frequency  of headaches also she is not sure if these are typical for her pseudotumor.  Her MRI was unchanged in comparison  to February 2019's MRI so over 2 years she has been stable her optic he has been appears normal brain volume is normal no extra-axial fluid collection, she does have deep right great ventricle gray matter findings, white matter findings left little bit more than right this also is unchanged.  She probably has chronic microvascular ischemic changes.  Demyelination would be very unlikely to give this pattern.  With her history of pseudotumor cerebri we performed a spinal tap for opening pressure was 23 cm, the laboratory for fluid- No oligoclonal bands, negative culture, only 1 white blood cells, normal glucose and protein.  PHYSICAL EXAM BMI 39. 6 kg/m2.  Neck size 15.5"   Mental Status: Alert, oriented, thought content affected by anxiety, grief and depression.    Speech fluent without evidence of aphasia.  Tearful. Able to follow 3 step commands without difficulty.  Montreal Cognitive Assessment  11/28/2019  Visuospatial/ Executive (0/5) 5  Naming (0/3) 3  Attention: Read list of digits (0/2) 2  Attention: Read list of letters (0/1) 1  Attention: Serial 7 subtraction starting at 100 (0/3) 3  Language: Repeat phrase (0/2) 2  Language : Fluency (0/1) 1  Abstraction (0/2) 2  Delayed Recall (0/5) 2  Orientation (0/6) 6  Total 27     Cranial Nerves: noted hypersensitivity to smells, but intact taste.   II-Discs flat bilaterally.  Visual fields intact.-Extraocular movements intact.  No diplopia. Pupils reactive bilaterally.Smile is symmetric, uvula midline, hearing intact. Normal bilateral shoulder shrug, midline tongue extension Motor: 5/5 bilaterally with normal tone and bulk, grip strength, no change in handwriting coordination to fine motor skills is intact. Sensory: Pinprick and light touch intact throughout, bilaterally.  Deep Tendon Reflexes: 2+ and symmetric throughout,  no clonus.  Plantars: Downgoing bilaterally Cerebellar: no falls, no changes in handwriting.  Normal finger-to-nose, normal rapid alternating movements and normal gait and station.  DIAGNOSTIC DATA (LABS, IMAGING, TESTING):  - I reviewed patient records, labs, notes, testing and imaging myself where available. [facility] - no access through epic.   Reviewed her ophthalmological records from Dr Eulogio Ditch, and [facility]. Allergist Dr. Prudy Feeler, MD at the [facility] of [place],  35 minutes. following this visit with preparation of laboratory, recent imaging studies, differential of diagnosis of vasculitis versus return of oligoclonal bands for other reasons, versus white matter disease, versus pseudotumor cerebri. Also noted alarming degree of depression and anxiety related to her recent illness and her brothers death, now very worried about her performance at work.  Stress has increased her headaches. here with:  1. Pseudotumor cerebri -she saw her ophthalmologist in  Feb 2021. when no optic disc pallor or retinal swelling was noted.  She has multiple eye diseases.    2.Obesity-BMI 39.61. However she had just stopped exercising at this point and has gained weight since.. She did not keep a 4 month revisit appointment in 2020- Covid related.  This makes pseudotumor worse and increases risk for OSA. Sleep  Study last negative for apnea- HST.    3. Migraine-discontinued topiramate due to cognitive delay, affecting her performance at work.  I will re-order Imitrex for her.   4. [facility]CI - subjectively stated. She is depressed, she is grieving and she is tear full, very worried about her job. Let's start SSRI and counseling.   Start with 10 mg a day for 30 days, then increase to 20 mg.  5) Insomnia - xanax is not a solution and can cause addiction.      Melvyn Novas, MD  11/28/2019, 3:01 PM   Fellow of ABPN,  ABSM and AASM member  [facility] [address] [place], [place] [address] [phone]

## 2019-12-06 ENCOUNTER — Telehealth: Payer: Self-pay | Admitting: Neurology

## 2019-12-06 NOTE — Telephone Encounter (Addendum)
error 

## 2019-12-27 ENCOUNTER — Ambulatory Visit: Payer: 59 | Admitting: Neurology

## 2020-02-12 ENCOUNTER — Ambulatory Visit (INDEPENDENT_AMBULATORY_CARE_PROVIDER_SITE_OTHER): Payer: 59 | Admitting: Primary Care

## 2020-03-01 ENCOUNTER — Other Ambulatory Visit: Payer: Self-pay | Admitting: Neurology

## 2020-03-04 ENCOUNTER — Ambulatory Visit (INDEPENDENT_AMBULATORY_CARE_PROVIDER_SITE_OTHER): Payer: 59 | Admitting: Family Medicine

## 2020-03-04 ENCOUNTER — Encounter: Payer: Self-pay | Admitting: Family Medicine

## 2020-03-04 ENCOUNTER — Other Ambulatory Visit: Payer: Self-pay

## 2020-03-04 VITALS — BP 142/82 | HR 70 | Ht 65.0 in | Wt 232.0 lb

## 2020-03-04 DIAGNOSIS — F5104 Psychophysiologic insomnia: Secondary | ICD-10-CM | POA: Diagnosis not present

## 2020-03-04 DIAGNOSIS — F329 Major depressive disorder, single episode, unspecified: Secondary | ICD-10-CM

## 2020-03-04 DIAGNOSIS — F32A Depression, unspecified: Secondary | ICD-10-CM

## 2020-03-04 DIAGNOSIS — F419 Anxiety disorder, unspecified: Secondary | ICD-10-CM | POA: Diagnosis not present

## 2020-03-04 DIAGNOSIS — G43109 Migraine with aura, not intractable, without status migrainosus: Secondary | ICD-10-CM

## 2020-03-04 MED ORDER — GABAPENTIN 300 MG PO CAPS: 300.0000 mg | ORAL_CAPSULE | Freq: Two times a day (BID) | ORAL | 3 refills | Status: DC

## 2020-03-04 NOTE — Patient Instructions (Signed)
We will continue Celexa 20mg  daily. I will increase gabapentin to 300mg  twice daily. Take these daily. Call if you have any side effects.   Talk with PCP about anxiety management. Buspar may be a good option for you.   Work on healthy lifestyle habits with well balanced diet and regular exercise. Stay well hydrated.   Follow up in 3 months   Major Depressive Disorder, Adult Major depressive disorder (MDD) is a mental health condition. MDD often makes you feel sad, hopeless, or helpless. MDD can also cause symptoms in your body. MDD can affect your:  Work.  School.  Relationships.  Other normal activities. MDD can range from mild to very bad. It may occur once (single episode MDD). It can also occur many times (recurrent MDD). The main symptoms of MDD often include:  Feeling sad, depressed, or irritable most of the time.  Loss of interest. MDD symptoms also include:  Sleeping too much or too little.  Eating too much or too little.  A change in your weight.  Feeling tired (fatigue) or having low energy.  Feeling worthless.  Feeling guilty.  Trouble making decisions.  Trouble thinking clearly.  Thoughts of suicide or harming others.  Feeling weak.  Feeling agitated.  Keeping yourself from being around other people (isolation). Follow these instructions at home: Activity  Do these things as told by your doctor: ? Go back to your normal activities. ? Exercise regularly. ? Spend time outdoors. Alcohol  Talk with your doctor about how alcohol can affect your antidepressant medicines.  Do not drink alcohol. Or, limit how much alcohol you drink. ? This means no more than 1 drink a day for nonpregnant women and 2 drinks a day for men. One drink equals one of these:  12 oz of beer.  5 oz of wine.  1 oz of hard liquor. General instructions  Take over-the-counter and prescription medicines only as told by your doctor.  Eat a healthy diet.  Get plenty of  sleep.  Find activities that you enjoy. Make time to do them.  Think about joining a support group. Your doctor may be able to suggest a group for you.  Keep all follow-up visits as told by your doctor. This is important. Where to find more information:  on Mental Illness: ? www.nami.org  [place] of Mental Health: ? [employer]  National Suicide Prevention Lifeline: ? 458 076 4227. This is free, 24-hour help. Contact a doctor if:  Your symptoms get worse.  You have new symptoms. Get help right away if:  You self-harm.  You see, hear, taste, smell, or feel things that are not present (hallucinate). If you ever feel like you may hurt yourself or others, or have thoughts about taking your own life, get help right away. You can go to your nearest emergency department or call:  Your local emergency services (911 in the [place]  A suicide crisis helpline, such as the National Suicide Prevention Lifeline: ? (229) 099-4776. This is open 24 hours a day. This information is not intended to replace advice given to you by your health care provider. Make sure you discuss any questions you have with your health care provider. Document Revised: 08/12/2017 Document Reviewed: 05/16/2016 Elsevier Patient Education  2020 Elsevier Inc.    Citalopram tablets What is this medicine? CITALOPRAM (sye TAL oh pram) is a medicine for depression. This medicine may be used for other purposes; ask your health care provider or pharmacist if you have questions. COMMON  BRAND NAME(S): Celexa What should I tell my health care provider before I take this medicine? They need to know if you have any of these conditions:  bleeding disorders  bipolar disorder or a family history of bipolar disorder  glaucoma  heart disease  history of irregular heartbeat  kidney disease  liver disease  low levels of magnesium or potassium in the blood  receiving  electroconvulsive therapy  seizures  suicidal thoughts, plans, or attempt; a previous suicide attempt by you or a family member  take medicines that treat or prevent blood clots  thyroid disease  an unusual or allergic reaction to citalopram, escitalopram, other medicines, foods, dyes, or preservatives  pregnant or trying to become pregnant  breast-feeding How should I use this medicine? Take this medicine by mouth with a glass of water. Follow the directions on the prescription label. You can take it with or without food. Take your medicine at regular intervals. Do not take your medicine more often than directed. Do not stop taking this medicine suddenly except upon the advice of your doctor. Stopping this medicine too quickly may cause serious side effects or your condition may worsen. A special MedGuide will be given to you by the pharmacist with each prescription and refill. Be sure to read this information carefully each time. Talk to your pediatrician regarding the use of this medicine in children. Special care may be needed. Patients over 76 years old may have a stronger reaction and need a smaller dose. Overdosage: If you think you have taken too much of this medicine contact a poison control center or emergency room at once. NOTE: This medicine is only for you. Do not share this medicine with others. What if I miss a dose? If you miss a dose, take it as soon as you can. If it is almost time for your next dose, take only that dose. Do not take double or extra doses. What may interact with this medicine? Do not take this medicine with any of the following medications:  certain medicines for fungal infections like fluconazole, itraconazole, ketoconazole, posaconazole, voriconazole  cisapride  dronedarone  escitalopram  linezolid  MAOIs like Carbex, Eldepryl, Marplan, Nardil, and Parnate  methylene blue (injected into a vein)  pimozide  thioridazine This medicine may  also interact with the following medications:  alcohol  amphetamines  aspirin and aspirin-like medicines  carbamazepine  certain medicines for depression, anxiety, or psychotic disturbances  certain medicines for infections like chloroquine, clarithromycin, erythromycin, furazolidone, isoniazid, pentamidine  certain medicines for migraine headaches like almotriptan, eletriptan, frovatriptan, naratriptan, rizatriptan, sumatriptan, zolmitriptan  certain medicines for sleep  certain medicines that treat or prevent blood clots like dalteparin, enoxaparin, warfarin  cimetidine  diuretics  dofetilide  fentanyl  lithium  methadone  metoprolol  NSAIDs, medicines for pain and inflammation, like ibuprofen or naproxen  omeprazole  other medicines that prolong the QT interval (cause an abnormal heart rhythm)  procarbazine  rasagiline  supplements like St. John's wort, kava kava, valerian  tramadol  tryptophan  ziprasidone This list may not describe all possible interactions. Give your health care provider a list of all the medicines, herbs, non-prescription drugs, or dietary supplements you use. Also tell them if you smoke, drink alcohol, or use illegal drugs. Some items may interact with your medicine. What should I watch for while using this medicine? Tell your doctor if your symptoms do not get better or if they get worse. Visit your doctor or health care professional for regular  checks on your progress. Because it may take several weeks to see the full effects of this medicine, it is important to continue your treatment as prescribed by your doctor. Patients and their families should watch out for new or worsening thoughts of suicide or depression. Also watch out for sudden changes in feelings such as feeling anxious, agitated, panicky, irritable, hostile, aggressive, impulsive, severely restless, overly excited and hyperactive, or not being able to sleep. If this  happens, especially at the beginning of treatment or after a change in dose, call your health care professional. Bonita Quin may get drowsy or dizzy. Do not drive, use machinery, or do anything that needs mental alertness until you know how this medicine affects you. Do not stand or sit up quickly, especially if you are an older patient. This reduces the risk of dizzy or fainting spells. Alcohol may interfere with the effect of this medicine. Avoid alcoholic drinks. Your mouth may get dry. Chewing sugarless gum or sucking hard candy, and drinking plenty of water will help. Contact your doctor if the problem does not go away or is severe. What side effects may I notice from receiving this medicine? Side effects that you should report to your doctor or health care professional as soon as possible:  allergic reactions like skin rash, itching or hives, swelling of the face, lips, or tongue  anxious  black, tarry stools  breathing problems  changes in vision  chest pain  confusion  elevated mood, decreased need for sleep, racing thoughts, impulsive behavior  eye pain  fast, irregular heartbeat  feeling faint or lightheaded, falls  feeling agitated, angry, or irritable  hallucination, loss of contact with reality  loss of balance or coordination  loss of memory  painful or prolonged erections  restlessness, pacing, inability to keep still  seizures  stiff muscles  suicidal thoughts or other mood changes  trouble sleeping  unusual bleeding or bruising  unusually weak or tired  vomiting Side effects that usually do not require medical attention (report to your doctor or health care professional if they continue or are bothersome):  change in appetite or weight  change in sex drive or performance  dizziness  headache  increased sweating  indigestion, nausea  tremors This list may not describe all possible side effects. Call your doctor for medical advice about side  effects. You may report side effects to FDA at 1-800-FDA-1088. Where should I keep my medicine? Keep out of reach of children. Store at room temperature between 15 and 30 degrees C (59 and 86 degrees F). Throw away any unused medicine after the expiration date. NOTE: This sheet is a summary. It may not cover all possible information. If you have questions about this medicine, talk to your doctor, pharmacist, or health care provider.  2020 Elsevier/Gold Standard (2018-08-21 09:05:36)   Gabapentin capsules or tablets What is this medicine? GABAPENTIN (GA ba pen tin) is used to control seizures in certain types of epilepsy. It is also used to treat certain types of nerve pain. This medicine may be used for other purposes; ask your health care provider or pharmacist if you have questions. COMMON BRAND NAME(S): Active-PAC with Gabapentin, Gabarone, Neurontin What should I tell my health care provider before I take this medicine? They need to know if you have any of these conditions:  history of drug abuse or alcohol abuse problem  kidney disease  lung or breathing disease  suicidal thoughts, plans, or attempt; a previous suicide attempt by you  or a family member  an unusual or allergic reaction to gabapentin, other medicines, foods, dyes, or preservatives  pregnant or trying to get pregnant  breast-feeding How should I use this medicine? Take this medicine by mouth with a glass of water. Follow the directions on the prescription label. You can take it with or without food. If it upsets your stomach, take it with food. Take your medicine at regular intervals. Do not take it more often than directed. Do not stop taking except on your doctor's advice. If you are directed to break the 600 or 800 mg tablets in half as part of your dose, the extra half tablet should be used for the next dose. If you have not used the extra half tablet within 28 days, it should be thrown away. A special MedGuide  will be given to you by the pharmacist with each prescription and refill. Be sure to read this information carefully each time. Talk to your pediatrician regarding the use of this medicine in children. While this drug may be prescribed for children as young as 3 years for selected conditions, precautions do apply. Overdosage: If you think you have taken too much of this medicine contact a poison control center or emergency room at once. NOTE: This medicine is only for you. Do not share this medicine with others. What if I miss a dose? If you miss a dose, take it as soon as you can. If it is almost time for your next dose, take only that dose. Do not take double or extra doses. What may interact with this medicine? This medicine may interact with the following medications:  alcohol  antihistamines for allergy, cough, and cold  certain medicines for anxiety or sleep  certain medicines for depression like amitriptyline, fluoxetine, sertraline  certain medicines for seizures like phenobarbital, primidone  certain medicines for stomach problems  general anesthetics like halothane, isoflurane, methoxyflurane, propofol  local anesthetics like lidocaine, pramoxine, tetracaine  medicines that relax muscles for surgery  narcotic medicines for pain  phenothiazines like chlorpromazine, mesoridazine, prochlorperazine, thioridazine This list may not describe all possible interactions. Give your health care provider a list of all the medicines, herbs, non-prescription drugs, or dietary supplements you use. Also tell them if you smoke, drink alcohol, or use illegal drugs. Some items may interact with your medicine. What should I watch for while using this medicine? Visit your doctor or health care provider for regular checks on your progress. You may want to keep a record at home of how you feel your condition is responding to treatment. You may want to share this information with your doctor or  health care provider at each visit. You should contact your doctor or health care provider if your seizures get worse or if you have any new types of seizures. Do not stop taking this medicine or any of your seizure medicines unless instructed by your doctor or health care provider. Stopping your medicine suddenly can increase your seizures or their severity. This medicine may cause serious skin reactions. They can happen weeks to months after starting the medicine. Contact your health care provider right away if you notice fevers or flu-like symptoms with a rash. The rash may be red or purple and then turn into blisters or peeling of the skin. Or, you might notice a red rash with swelling of the face, lips or lymph nodes in your neck or under your arms. Wear a medical identification bracelet or chain if you are taking  this medicine for seizures, and carry a card that lists all your medications. You may get drowsy, dizzy, or have blurred vision. Do not drive, use machinery, or do anything that needs mental alertness until you know how this medicine affects you. To reduce dizzy or fainting spells, do not sit or stand up quickly, especially if you are an older patient. Alcohol can increase drowsiness and dizziness. Avoid alcoholic drinks. Your mouth may get dry. Chewing sugarless gum or sucking hard candy, and drinking plenty of water will help. The use of this medicine may increase the chance of suicidal thoughts or actions. Pay special attention to how you are responding while on this medicine. Any worsening of mood, or thoughts of suicide or dying should be reported to your health care provider right away. Women who become pregnant while using this medicine may enroll in the [place] Antiepileptic Drug Pregnancy Registry by calling [phone]. This registry collects information about the safety of antiepileptic drug use during pregnancy. What side effects may I notice from receiving this  medicine? Side effects that you should report to your doctor or health care professional as soon as possible:  allergic reactions like skin rash, itching or hives, swelling of the face, lips, or tongue  breathing problems  rash, fever, and swollen lymph nodes  redness, blistering, peeling or loosening of the skin, including inside the mouth  suicidal thoughts, mood changes Side effects that usually do not require medical attention (report to your doctor or health care professional if they continue or are bothersome):  dizziness  drowsiness  headache  nausea, vomiting  swelling of ankles, feet, hands  tiredness This list may not describe all possible side effects. Call your doctor for medical advice about side effects. You may report side effects to FDA at 1-800-FDA-1088. Where should I keep my medicine? Keep out of reach of children. This medicine may cause accidental overdose and death if it taken by other adults, children, or pets. Mix any unused medicine with a substance like cat litter or coffee grounds. Then throw the medicine away in a sealed container like a sealed bag or a coffee can with a lid. Do not use the medicine after the expiration date. Store at room temperature between 15 and 30 degrees C (59 and 86 degrees F). NOTE: This sheet is a summary. It may not cover all possible information. If you have questions about this medicine, talk to your doctor, pharmacist, or health care provider.  2020 Elsevier/Gold Standard (2018-12-01 14:16:43)

## 2020-03-04 NOTE — Progress Notes (Signed)
PATIENT: Sherri Alvarado DOB: 14-Jul-1966  REASON FOR VISIT: follow up HISTORY FROM: patient  Chief Complaint  Patient presents with  . Follow-up    Rm 1 here for a f/u      HISTORY OF PRESENT ILLNESS: Today 03/06/20 Sherri Alvarado is a 54 y.o. female here today for follow up for headaches.  Topiramate was discontinued at her last visit due to concerns of brain fog.  Citalopram 20 mg daily was added as there were concerns of anxiety and depression as well.  She reports that she was doing very well for about 2 months.  She discontinued citalopram over a month ago as she was feeling better.  She has noted worsening of depression and right leg pain over the past few weeks.  She restarted citalopram about a week ago.  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 She is having an aching pain in the right hip and thigh. She does feel that depression is better on citalopram. She had lost about 20 pounds but started "comfort eating" again after stopping it.   HISTORY: (copied from Dr Dohmeier's note on 11/28/2019)  This is a revisit for this 54 year- old afro-american female patient, on 11/28/19:,  Sherri Alvarado contracted Covid in November 2020 had flulike symptoms for about 8 days but recovered quickly without hospitalization.  She reports that her brother however passed away from complications of Covid in December last year year.  She has since struggled with multitasking cognitive abilities and she appears depressed.  As she has mentioned her cognitive problems to my nurse a [place] cognitive assessment was performed today and she scored 27 out of 30 points.  All sleep points she lost word short-term memory.  She was able to multitask year and do visual-spatial path of the test as well as naming objects.  She is fully oriented to place and time and could do subtraction spelling and attention.  She is a 54 y.o. year old [place] female has a past medical history of Breast hypertrophy  (05/2014), Dental crowns present, bilateral breast reduction surgery (07/31/2014), Idiopathic intracranial hypertension, Migraine equivalent syndrome (10/25/2017), Pseudotumor cerebri, and S/P tooth extraction (06/04/2014). She has smoked marihuana , quit in November with her COVID illness. She started taking xanax for Anxiety , worsened d since December. 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   She is afraid of losing the job. Has insomnia, getting not enough rest. Xanax through PCP (!).   She is here followed for pseudotumor cerebri, and recently was diagnosed with glaucoma, high intraocular pressure , and cataract, had a detached retina in the left eye- needs Ophthalmologist -subspecialist.  headaches are more frequent again, no numbness but a lot of stress.  She is tearful.   10-2018 I have the pleasure of meeting Sherri Alvarado she is a meanwhile 54 year old African-American patient had presented with chronic paroxysmal hemicrania and a history of pseudotumor cerebri.  I had ordered an MRI and spinal tap for the patient performed in February 2019, at the time she did not want fluid to be taken and rather pursue weight loss.  Unfortunately her weight loss plans have not fulfilled and we are now meeting with the patient that still has occasional headaches, some some pain in her right leg and some numbness sensation in the left arm stiffness and numbness around the neck are also described.  Given that her neuroimaging report from February 2019 showed  only slight abnormalities nonspecific bilateral, subcortical, tiny white matter hyperintensities there is a vasculitis differential diagnosis to be resolved also we should look at demyelination.  The patient has no history history of an autoimmune disease, DM or CAD.    HISTORY OF PRESENT ILLNESS: 10-2016 Sherri Alvarado reports that she has had a stressful 2018, and tested positive for several food related allergies  including nuts, beef, gluten-wheat. She also broke out in hives which she initially thought was related to exercise.  Her extends her sides of the legs and arms have been covered in a rash.  The rash is no longer itching at this time but worse. EIA-since last induced allergies?  And knees have been found to show signs of osteoarthritis is not surprising: see age and weight. She stopped exercising at the gym between August and the beginning of the new year, and regained weight.  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  This brought on her migraines. weight has been her main risk factor for pseudotumor cerebri and her headaches have promptly gotten worse. Her neck pain responded to PT, and has now gotten worse again.   She asked for an elevated desk to stand at. She needs to address this also with her orthopedist and PCP.  I will concentrate on her Benign intracranial hypertension.  Has had last spinal tap was in 2017. She brought her ophthalmologists records. He did not find papilledema or optic disc palor but reported elevated inner eye pressure/ glaucoma to be watched. Right eye vitreous detachment.  HISTORY 11/10/16: Sherri Alvarado is a 54 year old female with a history of pseudotumor cerebri and meralgia paresthetica. She returns today for follow-up. She states in the last 2-3 months her headache frequency has increased. She states that she has headache 2-3 times a week. She typically wakes up with the headache. She did switch Topamax to the AM-25 mg.She states occasionally she will take a second dose but overall she is only been doing 25 mg daily. The patient had a follow-up last week with her ophthalmologist and she was put on eyedrops for glaucoma she reports that her ophthalmologist said that she had no swelling related to pseudotumor cerebri. The patient has also been losing weight. She states that  she has lost approximately 20 pounds in the last month and a half. She states that she is trying to lose weight. She is also exercising. She's had a sleep study in the past that was unremarkable. She states that in the last several weeks she's also been having numbness again on the right thigh. She denies any pain. She returns today for an evaluation.     REVIEW OF SYSTEMS: Out of a complete 14 system review of symptoms, the patient complains only of the following symptoms, headaches, right leg and hip pain, anxiety, depression and all other reviewed systems are negative.  ALLERGIES: Allergies  Allergen Reactions  . Banana Other (See Comments)    TINGLING OF TONGUE  . Flour Hives    Also, wheat bran   . Other Hives and Itching    TREE NUTS AND BEEF  . Codeine Itching    HOME MEDICATIONS: Outpatient Medications Prior to Visit  Medication Sig Dispense Refill  . citalopram (CELEXA) 20 MG tablet Take 1 tablet (20 mg total) by mouth daily. 30 tablet 5  . EPINEPHrine (AUVI-Q) 0.3 mg/0.3 mL IJ SOAJ injection Use as directed for severe  allergic reaction 2 Device 1  . latanoprost (XALATAN) 0.005 % ophthalmic solution     . Multiple Vitamin (MULTIVITAMIN) tablet Take 1 tablet by mouth daily.    . Omega-3 Fatty Acids (FISH OIL) 600 MG CAPS Take 1 capsule by mouth.    . triamcinolone cream (KENALOG) 0.1 %     . gabapentin (NEURONTIN) 100 MG capsule TAKE 1 CAPSULE BY MOUTH 3  TIMES DAILY 270 capsule 3  . ALPRAZolam (XANAX) 0.5 MG tablet Take 1 tablet (0.5 mg total) by mouth at bedtime as needed for anxiety (use prior to mri and LP for anxiety). 30 tablet 0   No facility-administered medications prior to visit.    PAST MEDICAL HISTORY: Past Medical History:  Diagnosis Date  . Breast hypertrophy 05/2014  . Dental crowns present   . Hx of bilateral breast reduction surgery 07/31/2014  . Idiopathic intracranial hypertension   . Migraine equivalent syndrome 10/25/2017  . Pseudotumor cerebri     . S/P tooth extraction 06/04/2014    PAST SURGICAL HISTORY: Past Surgical History:  Procedure Laterality Date  . BREAST REDUCTION SURGERY Bilateral 06/11/2014   Procedure: BILATERAL BREAST REDUCTION ;  Surgeon: Karie Fetch, MD;  Location: [facility];  Service: Plastics;  Laterality: Bilateral;  . CESAREAN SECTION     x 2  . REDUCTION MAMMAPLASTY Bilateral     FAMILY HISTORY: Family History  Problem Relation Age of Onset  . Multiple sclerosis Mother   . Heart murmur Mother   . Hypertension Mother   . Asthma Mother   . Food Allergy Mother        pepper  . Eczema Mother   . Kidney disease Father   . Cancer - Other Father        throat  . Eczema Father   . Asthma Maternal Aunt   . Asthma Maternal Grandmother   . Allergic rhinitis Neg Hx   . Angioedema Neg Hx   . Atopy Neg Hx   . Immunodeficiency Neg Hx   . Urticaria Neg Hx     SOCIAL HISTORY: Social History   Socioeconomic History  . Marital status: Divorced    Spouse name: Not on file  . Number of children: 2  . Years of education: 22  . Highest education level: Not on file  Occupational History    Employer: [employer]  Tobacco Use  . Smoking status: Never Smoker  . Smokeless tobacco: Never Used  Substance and Sexual Activity  . Alcohol use: Yes    Alcohol/week: 0.0 standard drinks    Comment: occasionally  . Drug use: No    Types: Marijuana    Comment: last used 04/2014  . Sexual activity: Not on file  Other Topics Concern  . Not on file  Social History Narrative   Separated, 2 children   Right handed   3 yrs of college   Patient drinks very little caffeine.   Social Determinants of Health   Financial Resource Strain:   . Difficulty of Paying Living Expenses:   Food Insecurity:   . Worried About Programme researcher, broadcasting/film/video in the Last Year:   . Barista in the Last Year:   Transportation Needs:   . Freight forwarder (Medical):   Marland Kitchen Lack of Transportation  (Non-Medical):   Physical Activity:   . Days of Exercise per Week:   . Minutes of Exercise per Session:   Stress:   . Feeling of Stress :  Social Connections:   . Frequency of Communication with Friends and Family:   . Frequency of Social Gatherings with Friends and Family:   . Attends Religious Services:   . Active Member of Clubs or Organizations:   . Attends Banker Meetings:   Marland Kitchen Marital Status:   Intimate Partner Violence:   . Fear of Current or Ex-Partner:   . Emotionally Abused:   Marland Kitchen Physically Abused:   . Sexually Abused:       PHYSICAL EXAM  Vitals:   03/04/20 1535  BP: (!) 142/82  Pulse: 70  Weight: 232 lb (105.2 kg)  Height:  (1.651 m)   Body mass index is 38.61 kg/m.  Generalized: Well developed, in no acute distress  Cardiology: normal rate and rhythm, no murmur noted Respiratory: clear to auscultation bilaterally  Neurological examination  Mentation: Alert oriented to time, place, history taking. Follows all commands speech and language fluent Cranial nerve II-XII: Pupils were equal round reactive to light. Extraocular movements were full, visual field were full on confrontational test. Facial sensation and strength were normal. Uvula tongue midline. Head turning and shoulder shrug  were normal and symmetric. Motor: The motor testing reveals 5 over 5 strength of all 4 extremities. Good symmetric motor tone is noted throughout.  Sensory: Sensory testing is intact to soft touch on all 4 extremities. No evidence of extinction is noted.  Coordination: Cerebellar testing reveals good finger-nose-finger and heel-to-shin bilaterally.  Gait and station: Gait is normal.  Reflexes: Deep tendon reflexes are symmetric and normal bilaterally.   DIAGNOSTIC DATA (LABS, IMAGING, TESTING) - I reviewed patient records, labs, notes, testing and imaging myself where available.  No flowsheet data found.   Lab Results  Component Value Date   HGB 11.9 (L)  06/11/2014      Component Value Date/Time   NA 148 (H) 10/02/2018 1650   K 4.4 10/02/2018 1650   CL 110 (H) 10/02/2018 1650   CO2 22 10/02/2018 1650   GLUCOSE 84 10/02/2018 1650   BUN 9 10/02/2018 1650   CREATININE 0.98 10/02/2018 1650   CALCIUM 9.5 10/02/2018 1650   PROT 7.2 10/02/2018 1650   ALBUMIN 4.5 10/02/2018 1650   AST 18 10/02/2018 1650   ALT 12 10/02/2018 1650   ALKPHOS 77 10/02/2018 1650   BILITOT 0.3 10/02/2018 1650   GFRNONAA 66 10/02/2018 1650   GFRAA 76 10/02/2018 1650   No results found for: CHOL, HDL, LDLCALC, LDLDIRECT, TRIG, CHOLHDL No results found for: ONGE9B No results found for: VITAMINB12 No results found for: TSH     ASSESSMENT AND PLAN 54 y.o. year old female  has a past medical history of Breast hypertrophy (05/2014), Dental crowns present, bilateral breast reduction surgery (07/31/2014), Idiopathic intracranial hypertension, Migraine equivalent syndrome (10/25/2017), Pseudotumor cerebri, and S/P tooth extraction (06/04/2014). here with     ICD-10-CM   1. Migraine equivalent syndrome  G43.109   2. Psychophysiological insomnia  F51.04   3. Anxiety and depression  F41.9    F32.9     Sherri Alvarado did note significant improvement in headaches, anxiety and depression after starting citalopram a few months ago.  Unfortunately, she discontinued this medication.  She has recently restarted citalopram. I will increase gabapentin to  twice daily to help with left hip pain and headaches. She was encouraged to discuss anxiety management with PCP. She is scheduled to see a counselor next week. She has her son in with psychiatry as well as he is battling alcoholism and  depression since losing his uncle. She was encouraged to work on healthy lifestyle habits. I would like to see her back in 3 months. She verbalizes understanding and agreement with this plan.    No orders of the defined types were placed in this encounter.    Meds ordered this encounter    Medications  . gabapentin (NEURONTIN) 300 MG capsule    Sig: Take 1 capsule (300 mg total) by mouth 2 (two) times daily.    Dispense:  180 capsule    Refill:  3    Requesting 1 year supply    Order Specific Question:   Supervising Provider    Answer:   Anson Fret [id number]      I spent 15 minutes with the patient. 50% of this time was spent counseling and educating patient on plan of care and medications.    Shawnie Dapper, FNP-C 03/06/2020, 8:56 AM [facility] [address] [place], [place] [address] [phone]

## 2020-03-06 ENCOUNTER — Encounter: Payer: Self-pay | Admitting: Family Medicine

## 2020-04-14 IMAGING — XA DG SPINAL PUNCT LUMBAR DIAG WITH FL CT GUIDANCE
1 series · 1 of 1 positions shown · non-contrast
Comparison: none

CLINICAL DATA: Pseudotumor cerebri. Evaluation for vasculitis.

[Series 1: ortho standard · 1 of 1 slices shown]
[im 1/1]
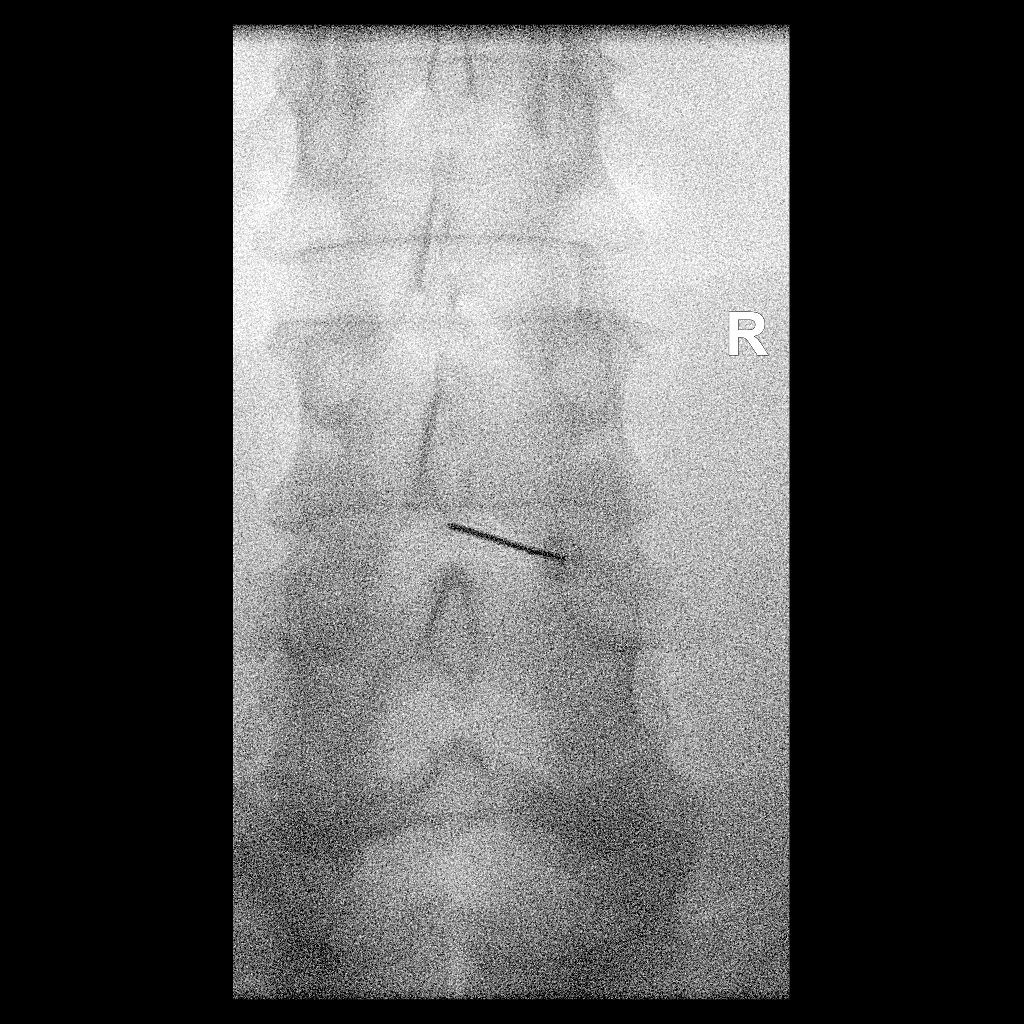

[1 of 1 positions shown; findings below may reference images not displayed]

EXAM:
DIAGNOSTIC LUMBAR PUNCTURE UNDER FLUOROSCOPIC GUIDANCE

FLUOROSCOPY TIME:  Fluoroscopy Time:  5 seconds

Radiation Exposure Index (if provided by the fluoroscopic device):
14.09 microGray*m^2

Number of Acquired Spot Images: 0

PROCEDURE:
Informed consent was obtained from the patient prior to the
procedure, including potential complications of headache, allergy,
and pain. With the patient prone, the lower back was prepped with
Betadine. 1% Lidocaine was used for local anesthesia. Lumbar
puncture was performed at the L3-4 level using a 6 inch 20 gauge
needle via a right interlaminar approach with return of clear CSF
with an opening pressure of 23 cm water (measured in the left
lateral decubitus position). 11 mL of CSF were obtained for
laboratory studies. Closing pressure was 13 cm water. The patient
tolerated the procedure well and there were no apparent
complications.
IMPRESSION: Successful fluoroscopically guided lumbar puncture. Opening pressure
of 23 cm water.

## 2020-05-05 ENCOUNTER — Ambulatory Visit: Payer: 59 | Admitting: Family Medicine

## 2020-06-03 ENCOUNTER — Other Ambulatory Visit: Payer: Self-pay | Admitting: Neurology

## 2020-06-18 ENCOUNTER — Ambulatory Visit: Payer: 59 | Admitting: Family Medicine

## 2020-06-19 ENCOUNTER — Ambulatory Visit (INDEPENDENT_AMBULATORY_CARE_PROVIDER_SITE_OTHER): Payer: 59 | Admitting: Family Medicine

## 2020-06-19 ENCOUNTER — Encounter: Payer: Self-pay | Admitting: Family Medicine

## 2020-06-19 ENCOUNTER — Other Ambulatory Visit: Payer: Self-pay | Admitting: Family Medicine

## 2020-06-19 VITALS — BP 130/88 | HR 70 | Ht 65.0 in | Wt 218.0 lb

## 2020-06-19 DIAGNOSIS — G43109 Migraine with aura, not intractable, without status migrainosus: Secondary | ICD-10-CM

## 2020-06-19 DIAGNOSIS — F32A Depression, unspecified: Secondary | ICD-10-CM | POA: Diagnosis not present

## 2020-06-19 DIAGNOSIS — Z1231 Encounter for screening mammogram for malignant neoplasm of breast: Secondary | ICD-10-CM

## 2020-06-19 DIAGNOSIS — F419 Anxiety disorder, unspecified: Secondary | ICD-10-CM

## 2020-06-19 NOTE — Progress Notes (Signed)
PATIENT: Sherri Alvarado DOB: 1966-08-03  REASON FOR VISIT: follow up HISTORY FROM: patient  Chief Complaint  Patient presents with  . Follow-up  . Migraine     HISTORY OF PRESENT ILLNESS: Today 06/19/20 Sherri Alvarado is a 54 y.o. female here today for follow up for headaches. She resumed citalopram and we increased gabapentin to  BID in 02/2020. PCP increased citalopram to .  Headaches have improved. She reports having 2-3 headaches every week with 1 or so being migrainous. She is taking OTC analgesics for abortive therapy. She is scared to add any other medications. She has failed sumatriptan. She is having more difficulty with hip pain. She was seen by ortho and diagnosed with right sided bursitis. She was started on Nsaids and tramadol. She started breaking out with a rash and has had to stop these meds. She is doing PT which helps. She is having numbness of left leg. B12 was low.  She has lost 14 pounds. Her son is back home.    HISTORY: (copied from my note on 03/04/2020)  Sherri Alvarado is a 54 y.o. female here today for follow up for headaches.  Topiramate was discontinued at her last visit due to concerns of brain fog.  Citalopram 20 mg daily was added as there were concerns of anxiety and depression as well.  She reports that she was doing very well for about 2 months.  She discontinued citalopram over a month ago as she was feeling better.  She has noted worsening of depression and right leg pain over the past few weeks.  She restarted citalopram about a week ago.  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 She is having an aching pain in the right hip and thigh. She does feel that depression is better on citalopram. She had lost about 20 pounds but started "comfort eating" again after stopping it.   HISTORY: (copied from Dr Dohmeier's note on 11/28/2019)  This is a revisit for this 54year-old afro-american female patient, on 11/28/19:,  [name] contracted Covid in November 2020 had flulike symptoms for about 8 days but recovered quickly without hospitalization. She reports that her brother however passed away from complications of Covid in December last year year. She has sincestruggled with multitasking cognitive abilities and she appears depressed. As she has mentioned her cognitive problems to my nurse a [place] cognitive assessment was performed today and she scored 27 out of 30 points. All sleep points she lost word short-term memory. She was able to multitask year and do visual-spatial path of the test as well as naming objects. She is fully oriented to place and time and could do subtraction spelling and attention.  She is a53 y.o.year old AAfemalehas a past medical history of Breast hypertrophy (05/2014), Dental crowns present, bilateral breast reduction surgery (07/31/2014), Idiopathic intracranial hypertension, Migraine equivalent syndrome (10/25/2017), Pseudotumor cerebri, and S/P tooth extraction (06/04/2014). She has smoked marihuana , quit in November with her COVID illness. She started taking xanax for Anxiety , worsened d since December. 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 She is afraid of losing the job. Has insomnia, getting not enough rest. Xanax through PCP (!).   She is here followed for pseudotumor cerebri, and recently was diagnosed with glaucoma, high intraocular pressure , and cataract, had a detached retina in the left eye- needs Ophthalmologist -subspecialist.  headaches are more frequent again, no numbness but a lot  of stress. She is tearful.   10-2018 I have the pleasure of meeting [name] she is a meanwhile 54 year old African-American patient had presented with chronic paroxysmal hemicrania and a history of pseudotumor cerebri. I had ordered an MRI and spinal tap for the patient performed in February 2019, at the time she did not want fluid to be  taken and rather pursue weight loss. Unfortunately her weight loss plans have not fulfilled and we are now meeting with the patient that still has occasional headaches, some some pain in her right leg and some numbness sensation in the left arm stiffness and numbness around the neck are also described. Given that her neuroimaging report from February 2019 showed only slight abnormalities nonspecific bilateral, subcortical, tiny white matter hyperintensities there is a vasculitis differential diagnosis to be resolved also we should look at demyelination. The patient has no history history of an autoimmune disease, DM or CAD.    HISTORY OF PRESENT ILLNESS: 10-2016 Sherri Alvarado reports that she has had a stressful 2018, and tested positive for several food related allergies including nuts, beef, gluten-wheat. She also broke out in hives which she initially thought was related to exercise. Her extends her sides of the legs and arms have been covered in a rash. The rash is no longer itching at this time but worse. EIA-since last induced allergies? And knees have been found to show signs of osteoarthritis is not surprising: see age and weight. She stopped exercising at the gym between August and the beginning of the new year, and regained weight. 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 This brought on her migraines. weight has been her main risk factor for pseudotumor cerebri and her headaches have promptly gotten worse. Her neck pain responded to PT, and has now gotten worse again.  She asked for an elevated desk to stand at. She needs to address this also with her orthopedist and PCP. I will concentrate on her Benign intracranial hypertension.  Has had last spinal tap was in 2017. She brought her ophthalmologists records. He did not find papilledema or optic disc palor but reported elevated inner eye  pressure/ glaucoma to be watched. Right eye vitreous detachment.  HISTORY 11/10/16: Sherri Alvarado is a 54 year old female with a history of pseudotumor cerebri and meralgia paresthetica. She returns today for follow-up. She states in the last 2-3 months her headache frequency has increased. She states that she has headache 2-3 times a week. She typically wakes up with the headache. She did switch Topamax to the AM-25 mg.She states occasionally she will take a second dose but overall she is only been doing 25 mg daily. The patient had a follow-up last week with her ophthalmologist and she was put on eyedrops for glaucoma she reports that her ophthalmologist said that she had no swelling related to pseudotumor cerebri. The patient has also been losing weight. She states that she has lost approximately 20 pounds in the last month and a half. She states that she is trying to lose weight. She is also exercising. She's had a sleep study in the past that was unremarkable. She states that in the last several weeks she's also been having numbness again on the right thigh. She denies any pain. She returns today for an evaluation.     REVIEW OF SYSTEMS: Out of a complete 14 system review of symptoms, the patient complains only of  the following symptoms, headaches, hip pain, numbness left leg, anxiety, depression and all other reviewed systems are negative.  ALLERGIES: Allergies  Allergen Reactions  . Banana Other (See Comments)    TINGLING OF TONGUE  . Flour Hives    Also, wheat bran   . Other Hives and Itching    TREE NUTS AND BEEF  . Codeine Itching    HOME MEDICATIONS: Outpatient Medications Prior to Visit  Medication Sig Dispense Refill  . citalopram (CELEXA) 20 MG tablet TAKE ONE TABLET BY MOUTH DAILY. 90 tablet 1  . gabapentin (NEURONTIN) 300 MG capsule Take 1 capsule (300 mg total) by mouth 2 (two) times daily. 180 capsule 3  . latanoprost (XALATAN) 0.005 % ophthalmic solution     .  mirtazapine (REMERON) 15 MG tablet Take 15 mg by mouth at bedtime.    . Multiple Vitamin (MULTIVITAMIN) tablet Take 1 tablet by mouth daily.    Marland Kitchen triamcinolone cream (KENALOG) 0.1 %     . vitamin B-12 (CYANOCOBALAMIN) 50 MCG tablet Take 50 mcg by mouth daily.    Marland Kitchen EPINEPHrine (AUVI-Q) 0.3 mg/0.3 mL IJ SOAJ injection Use as directed for severe allergic reaction 2 Device 1  . Omega-3 Fatty Acids (FISH OIL) 600 MG CAPS Take 1 capsule by mouth.     No facility-administered medications prior to visit.    PAST MEDICAL HISTORY: Past Medical History:  Diagnosis Date  . Breast hypertrophy 05/2014  . Dental crowns present   . Hx of bilateral breast reduction surgery 07/31/2014  . Idiopathic intracranial hypertension   . Migraine equivalent syndrome 10/25/2017  . Pseudotumor cerebri   . S/P tooth extraction 06/04/2014    PAST SURGICAL HISTORY: Past Surgical History:  Procedure Laterality Date  . BREAST REDUCTION SURGERY Bilateral 06/11/2014   Procedure: BILATERAL BREAST REDUCTION ;  Surgeon: Karie Fetch, MD;  Location: [facility];  Service: Plastics;  Laterality: Bilateral;  . CESAREAN SECTION     x 2  . REDUCTION MAMMAPLASTY Bilateral     FAMILY HISTORY: Family History  Problem Relation Age of Onset  . Multiple sclerosis Mother   . Heart murmur Mother   . Hypertension Mother   . Asthma Mother   . Food Allergy Mother        pepper  . Eczema Mother   . Kidney disease Father   . Cancer - Other Father        throat  . Eczema Father   . Asthma Maternal Aunt   . Asthma Maternal Grandmother   . Allergic rhinitis Neg Hx   . Angioedema Neg Hx   . Atopy Neg Hx   . Immunodeficiency Neg Hx   . Urticaria Neg Hx     SOCIAL HISTORY: Social History   Socioeconomic History  . Marital status: Divorced    Spouse name: Not on file  . Number of children: 2  . Years of education: 24  . Highest education level: Not on file  Occupational History    Employer: [employer]  Tobacco Use  . Smoking status: Never Smoker  . Smokeless tobacco: Never Used  Substance and Sexual Activity  . Alcohol use: Yes    Alcohol/week: 0.0 standard drinks    Comment: occasionally  . Drug use: No    Types: Marijuana    Comment: last used 04/2014  . Sexual activity: Not on file  Other Topics Concern  . Not on file  Social History Narrative   Separated, 2 children  Right handed   3 yrs of college   Patient drinks very little caffeine.   Social Determinants of Health   Financial Resource Strain:   . Difficulty of Paying Living Expenses: Not on file  Food Insecurity:   . Worried About Programme researcher, broadcasting/film/video in the Last Year: Not on file  . Ran Out of Food in the Last Year: Not on file  Transportation Needs:   . Lack of Transportation (Medical): Not on file  . Lack of Transportation (Non-Medical): Not on file  Physical Activity:   . Days of Exercise per Week: Not on file  . Minutes of Exercise per Session: Not on file  Stress:   . Feeling of Stress : Not on file  Social Connections:   . Frequency of Communication with Friends and Family: Not on file  . Frequency of Social Gatherings with Friends and Family: Not on file  . Attends Religious Services: Not on file  . Active Member of Clubs or Organizations: Not on file  . Attends Banker Meetings: Not on file  . Marital Status: Not on file  Intimate Partner Violence:   . Fear of Current or Ex-Partner: Not on file  . Emotionally Abused: Not on file  . Physically Abused: Not on file  . Sexually Abused: Not on file      PHYSICAL EXAM  Vitals:   06/19/20 1537  BP: 130/88  Pulse: 70  Weight: 218 lb (98.9 kg)  Height: 5\' 5"  (1.651 m)   Body mass index is 36.28 kg/m.  Generalized: Well developed, in no acute distress  Cardiology: normal rate and rhythm, no murmur noted Respiratory: clear to auscultation bilaterally  Neurological examination  Mentation: Alert oriented to time, place,  history taking. Follows all commands speech and language fluent Cranial nerve II-XII: Pupils were equal round reactive to light. Extraocular movements were full, visual field were full  Motor: The motor testing reveals 5 over 5 strength of all 4 extremities. Good symmetric motor tone is noted throughout.  Gait and station: Gait is normal.    DIAGNOSTIC DATA (LABS, IMAGING, TESTING) - I reviewed patient records, labs, notes, testing and imaging myself where available.  No flowsheet data found.   Lab Results  Component Value Date   HGB 11.9 (L) 06/11/2014      Component Value Date/Time   NA 148 (H) 10/02/2018 1650   K 4.4 10/02/2018 1650   CL 110 (H) 10/02/2018 1650   CO2 22 10/02/2018 1650   GLUCOSE 84 10/02/2018 1650   BUN 9 10/02/2018 1650   CREATININE 0.98 10/02/2018 1650   CALCIUM 9.5 10/02/2018 1650   PROT 7.2 10/02/2018 1650   ALBUMIN 4.5 10/02/2018 1650   AST 18 10/02/2018 1650   ALT 12 10/02/2018 1650   ALKPHOS 77 10/02/2018 1650   BILITOT 0.3 10/02/2018 1650   GFRNONAA 66 10/02/2018 1650   GFRAA 76 10/02/2018 1650   No results found for: CHOL, HDL, LDLCALC, LDLDIRECT, TRIG, CHOLHDL No results found for: 10/04/2018 No results found for: VITAMINB12 No results found for: TSH     ASSESSMENT AND PLAN 54 y.o. year old female  has a past medical history of Breast hypertrophy (05/2014), Dental crowns present, bilateral breast reduction surgery (07/31/2014), Idiopathic intracranial hypertension, Migraine equivalent syndrome (10/25/2017), Pseudotumor cerebri, and S/P tooth extraction (06/04/2014). here with     ICD-10-CM   1. Migraine equivalent syndrome  G43.109   2. Anxiety and depression  F41.9    F32.A  Lashonne reports improvement in migraines following increased doses of citalopram and gabapentin.  She will continue citalopram 40 mg daily as prescribed by PCP.  We will continue gabapentin 300 mg twice daily.  She may continue over-the-counter analgesics but was  advised to avoid taking these more than 1-2 times weekly.  She was encouraged to continue close follow-up with primary care and orthopedics for management of pain.  Healthy lifestyle habits encouraged.  She will follow up with me in 1 year, sooner if needed.  She verbalizes understanding and agreement with this plan.   No orders of the defined types were placed in this encounter.    No orders of the defined types were placed in this encounter.     I spent 20 minutes with the patient. 50% of this time was spent counseling and educating patient on plan of care and medications.    Shawnie Dapper, FNP-C 06/19/2020, 3:39 PM [facility] [address], [address] [place], [place] [address] [phone]

## 2020-06-19 NOTE — Patient Instructions (Addendum)
We will continue gabapentin 300mg  twice daily. Continue OTC analgesics for abortive therapy. Remember not to take more than 2 time a week.   Drink plenty water, well balanced diet and regular exercise will help.   Follow up in 1 year   Migraine Headache A migraine headache is a very strong throbbing pain on one side or both sides of your head. This type of headache can also cause other symptoms. It can last from 4 hours to 3 days. Talk with your doctor about what things may bring on (trigger) this condition. What are the causes? The exact cause of this condition is not known. This condition may be triggered or caused by:  Drinking alcohol.  Smoking.  Taking medicines, such as: ? Medicine used to treat chest pain (nitroglycerin). ? Birth control pills. ? Estrogen. ? Some blood pressure medicines.  Eating or drinking certain products.  Doing physical activity. Other things that may trigger a migraine headache include:  Having a menstrual period.  Pregnancy.  Hunger.  Stress.  Not getting enough sleep or getting too much sleep.  Weather changes.  Tiredness (fatigue). What increases the risk?  Being 33-28 years old.  Being female.  Having a family history of migraine headaches.  Being Caucasian.  Having depression or anxiety.  Being very overweight. What are the signs or symptoms?  A throbbing pain. This pain may: ? Happen in any area of the head, such as on one side or both sides. ? Make it hard to do daily activities. ? Get worse with physical activity. ? Get worse around bright lights or loud noises.  Other symptoms may include: ? Feeling sick to your stomach (nauseous). ? Vomiting. ? Dizziness. ? Being sensitive to bright lights, loud noises, or smells.  Before you get a migraine headache, you may get warning signs (an aura). An aura may include: ? Seeing flashing lights or having blind spots. ? Seeing bright spots, halos, or zigzag  lines. ? Having tunnel vision or blurred vision. ? Having numbness or a tingling feeling. ? Having trouble talking. ? Having weak muscles.  Some people have symptoms after a migraine headache (postdromal phase), such as: ? Tiredness. ? Trouble thinking (concentrating). How is this treated?  Taking medicines that: ? Relieve pain. ? Relieve the feeling of being sick to your stomach. ? Prevent migraine headaches.  Treatment may also include: ? Having acupuncture. ? Avoiding foods that bring on migraine headaches. ? Learning ways to control your body functions (biofeedback). ? Therapy to help you know and deal with negative thoughts (cognitive behavioral therapy). Follow these instructions at home: Medicines  Take over-the-counter and prescription medicines only as told by your doctor.  Ask your doctor if the medicine prescribed to you: ? Requires you to avoid driving or using heavy machinery. ? Can cause trouble pooping (constipation). You may need to take these steps to prevent or treat trouble pooping:  Drink enough fluid to keep your pee (urine) pale yellow.  Take over-the-counter or prescription medicines.  Eat foods that are high in fiber. These include beans, whole grains, and fresh fruits and vegetables.  Limit foods that are high in fat and sugar. These include fried or sweet foods. Lifestyle  Do not drink alcohol.  Do not use any products that contain nicotine or tobacco, such as cigarettes, e-cigarettes, and chewing tobacco. If you need help quitting, ask your doctor.  Get at least 8 hours of sleep every night.  Limit and deal with stress. General instructions  Keep a journal to find out what may bring on your migraine headaches. For example, write down: ? What you eat and drink. ? How much sleep you get. ? Any change in what you eat or drink. ? Any change in your medicines.  If you have a migraine headache: ? Avoid things that make your symptoms  worse, such as bright lights. ? It may help to lie down in a dark, quiet room. ? Do not drive or use heavy machinery. ? Ask your doctor what activities are safe for you.  Keep all follow-up visits as told by your doctor. This is important. Contact a doctor if:  You get a migraine headache that is different or worse than others you have had.  You have more than 15 headache days in one month. Get help right away if:  Your migraine headache gets very bad.  Your migraine headache lasts longer than 72 hours.  You have a fever.  You have a stiff neck.  You have trouble seeing.  Your muscles feel weak or like you cannot control them.  You start to lose your balance a lot.  You start to have trouble walking.  You pass out (faint).  You have a seizure. Summary  A migraine headache is a very strong throbbing pain on one side or both sides of your head. These headaches can also cause other symptoms.  This condition may be treated with medicines and changes to your lifestyle.  Keep a journal to find out what may bring on your migraine headaches.  Contact a doctor if you get a migraine headache that is different or worse than others you have had.  Contact your doctor if you have more than 15 headache days in a month. This information is not intended to replace advice given to you by your health care provider. Make sure you discuss any questions you have with your health care provider. Document Revised: 12/22/2018 Document Reviewed: 10/12/2018 Elsevier Patient Education  [place].

## 2020-06-24 ENCOUNTER — Telehealth: Payer: Self-pay | Admitting: *Deleted

## 2020-06-24 ENCOUNTER — Telehealth: Payer: Self-pay | Admitting: Family Medicine

## 2020-06-24 NOTE — Telephone Encounter (Signed)
Signed to MR.  

## 2020-06-24 NOTE — Telephone Encounter (Signed)
FMLA paperwork faxed to [employer] at [phone] on 06/24/2020.

## 2020-06-24 NOTE — Telephone Encounter (Signed)
[employer] FML[employer] intermittent form completed to AL/NP for review signature.

## 2020-06-30 ENCOUNTER — Other Ambulatory Visit: Payer: Self-pay

## 2020-06-30 ENCOUNTER — Encounter: Payer: Self-pay | Admitting: Allergy

## 2020-06-30 ENCOUNTER — Ambulatory Visit (INDEPENDENT_AMBULATORY_CARE_PROVIDER_SITE_OTHER): Payer: 59 | Admitting: Allergy

## 2020-06-30 VITALS — BP 108/70 | HR 85 | Temp 97.6°F | Resp 14 | Ht 65.0 in | Wt 230.2 lb

## 2020-06-30 DIAGNOSIS — L2084 Intrinsic (allergic) eczema: Secondary | ICD-10-CM | POA: Diagnosis not present

## 2020-06-30 DIAGNOSIS — T7800XD Anaphylactic reaction due to unspecified food, subsequent encounter: Secondary | ICD-10-CM

## 2020-06-30 MED ORDER — MUPIROCIN CALCIUM 2 % EX CREA: TOPICAL_CREAM | CUTANEOUS | 2 refills | Status: DC

## 2020-06-30 MED ORDER — TRIAMCINOLONE ACETONIDE 0.1 % EX OINT: 1.0000 "application " | TOPICAL_OINTMENT | Freq: Two times a day (BID) | CUTANEOUS | 3 refills | Status: AC

## 2020-06-30 MED ORDER — METHYLPREDNISOLONE ACETATE 40 MG/ML IJ SUSP
80.0000 mg | Freq: Once | INTRAMUSCULAR | Status: AC
  Administered 2020-06-30: 80 mg via INTRAMUSCULAR

## 2020-06-30 NOTE — Progress Notes (Signed)
Follow-up Note  RE: Sherri Alvarado MRN: [id number] DOB: October 31, 1965 Date of Office Visit: 06/30/2020   History of present illness: Sherri Alvarado is a 54 y.o. female presenting today for eczema flare.  She was last seen in the office on 07/06/17 by myself.  She states she has been having period of time of very stressful life events. She states her brother died over the winter due to covid.  She also found out her son's father was having addiction issues and that has been stressful.  She also feels like one of her sons may be struggling with alcohol.  She also has been having issues with her leg with ?bursitis and having pain.  She was taking ibuprofen for her leg pain rather consistent but thought it could be contributing to her eczema flaring and stopped.  Now using gabapentin instead.  She has been trying to wear loose clothing.  She was using triamcinolone which was helpful however was only prescribe small tubes and would run out fast as her arms, back, neck, legs are all involved.  She also reports bathing about twice a week and using shea butter and oil for moisturization.  She states it felt like her thighs were oozy earlier. She also states she has been trying to eat more natural foods but notes that she has had some wheat containing foods and wheat does flare her eczema.   She was checked for rheumatoid issues she states last year.  She had covid as well.  She states her memory has changed.  Her neurologist (sees for pseudotumor cerebri) started her on anti-anxiety medication.   She no longer sees dermatologist.   She states she has not had any further issues with hives since last visit.     Review of systems: Review of Systems  Constitutional: Negative.   HENT: Negative.   Eyes: Negative.   Respiratory: Negative.   Cardiovascular: Negative.   Gastrointestinal: Negative.   Musculoskeletal: Positive for joint pain.  Skin: Positive for itching and rash.  Neurological:  Negative.     All other systems negative unless noted above in HPI  Past medical/social/surgical/family history have been reviewed and are unchanged unless specifically indicated below.  No changes  Medication List: Current Outpatient Medications  Medication Sig Dispense Refill  . citalopram (CELEXA) 40 MG tablet Take 40 mg by mouth daily.    Marland Kitchen gabapentin (NEURONTIN) 300 MG capsule Take 1 capsule (300 mg total) by mouth 2 (two) times daily. 180 capsule 3  . latanoprost (XALATAN) 0.005 % ophthalmic solution     . mirtazapine (REMERON) 15 MG tablet Take 15 mg by mouth at bedtime.    . Multiple Vitamin (MULTIVITAMIN) tablet Take 1 tablet by mouth daily.    Marland Kitchen triamcinolone cream (KENALOG) 0.1 %     . vitamin B-12 (CYANOCOBALAMIN) 50 MCG tablet Take 50 mcg by mouth daily.    . mupirocin cream (BACTROBAN) 2 % Apply to open skin ares to help them heal 30 g 2  . triamcinolone ointment (KENALOG) 0.1 % Apply 1 application topically 2 (two) times daily. Apply to affected areas. Be sure to avoid the face, neck, armpits and groin areas. 600 g 3   No current facility-administered medications for this visit.     Known medication allergies: Allergies  Allergen Reactions  . Banana Other (See Comments)    TINGLING OF TONGUE  . Flour Hives    Also, wheat bran   . Other Hives and Itching  TREE NUTS AND BEEF  . Codeine Itching     Physical examination: Blood pressure 108/70, pulse 85, temperature 97.6 F (36.4 C), resp. rate 14, height 5\' 5"  (1.651 m), weight 230 lb 3.2 oz (104.4 kg), SpO2 100 %.  General: Alert, interactive, in no acute distress. HEENT: PERRLA, TMs pearly gray, turbinates minimally edematous without discharge, post-pharynx non erythematous. Neck: Supple without lymphadenopathy. Lungs: Clear to auscultation without wheezing, rhonchi or rales. {no increased work of breathing. CV: Normal S1, S2 without murmurs. Abdomen: Nondistended, nontender. Skin: Dry, erythematous,  excoriated patches on the posterior forearm b/l, posterior neck extending to upper back, low mid back, back of thighs posteriorly; no open or oozing areas. Extremities:  No clubbing, cyanosis or edema. Neuro:   Grossly intact.  Diagnositics/Labs: None today  Assessment and plan: Atopic dermatitis   - currently flared   - Bathe and soak for 5-10 minutes in warm water once a day. Pat dry.  Immediately apply the below cream prescribed to flared (red, itchy, patchy, scaly, flaky, irritated) areas only. Wait 5-10 minutes and then apply moisturizer like Eucerin, Cetaphil, Aquaphor, Vaseline twice a day all over.  To affected areas on the body (below the face and neck), apply: . Triamcinolone 0.1 % ointment twice a day as needed. . With ointments be careful to avoid the armpits and groin area. - Make a note of any foods that make eczema worse. Avoid the allergic foods below - Keep finger nails trimmed. - use bactroban ointment on open skin ares to help them heal.  This is a antibacterial ointment - for nighttime itch can take Hydroxyzine 25mg  daily at bedtime as needed - Dupixent injections discussed today including benefits and risk as well as protocol.  Dupixent would be a great addition for your skin care control.  Brochure provided and will have Tammy, our injectable nurse coordinator, contact to you regarding insurance.  - steroid injection provided in office      Food allergy    - continue avoidance of nuts, wheat and beef    - have access to epinephrine device and emergency action plan.   Follow-up 2-3 months or sooner if needed  I appreciate the opportunity to take part in [name]'s care. Please do not hesitate to contact me with questions.  Sincerely,   , MD Allergy/Immunology [facility]

## 2020-06-30 NOTE — Patient Instructions (Addendum)
Eczema   - Bathe and soak for 5-10 minutes in warm water once a day. Pat dry.  Immediately apply the below cream prescribed to flared (red, itchy, patchy, scaly, flaky, irritated) areas only. Wait 5-10 minutes and then apply moisturizer like Eucerin, Cetaphil, Aquaphor, Vaseline twice a day all over.  To affected areas on the body (below the face and neck), apply: . Triamcinolone 0.1 % ointment twice a day as needed. . With ointments be careful to avoid the armpits and groin area. - Make a note of any foods that make eczema worse. Avoid the allergic foods below - Keep finger nails trimmed. - use bactroban ointment on open skin ares to help them heal.  This is a antibacterial ointment - for nighttime itch can take Hydroxyzine 25mg  daily at bedtime as needed - Dupixent injections discussed today including benefits and risk as well as protocol.  Dupixent would be a great addition for your skin care control.  Brochure provided and will have Tammy, our injectable nurse coordinator, contact to you regarding insurance.  - steroid injection provided in office      Food allergy    - continue avoidance of nuts, wheat and beef    - have access to epinephrine device and emergency action plan.   Follow-up 2-3 months or sooner if needed

## 2020-07-01 ENCOUNTER — Telehealth: Payer: Self-pay | Admitting: *Deleted

## 2020-07-01 NOTE — Telephone Encounter (Signed)
-----   Message from [facility] Hires, MD sent at 06/30/2020  2:32 PM EDT ----- Pt wanted to know how much dupixent for eczema would cost for her out of pocket.

## 2020-07-01 NOTE — Telephone Encounter (Signed)
L/m for patient to discuss affordability for Dupixent - copay card and Ins approval.  Will need her PBM info if she wants to proceed with same

## 2020-07-10 ENCOUNTER — Ambulatory Visit: Payer: 59

## 2020-07-10 NOTE — Telephone Encounter (Signed)
Patient called wanting to know why she hasn't gotten any relief from the Depo Shot she received on 06/30/2020. She is also wondering if the dupixent is the very last option for her?  Please Advise

## 2020-07-10 NOTE — Telephone Encounter (Signed)
Pt is aware.  

## 2020-07-10 NOTE — Telephone Encounter (Signed)
Oh wow she has not noted any improvement in her skin with the steroid injection?  If she has not noted any improvement in her eczema flare with steroid injection then there is not a better treatment for quicker onset of action.   Yes dupixent would be the next step here be getting better control of her eczema.   Last step would be to get back in to dermatologist.

## 2020-07-28 NOTE — Telephone Encounter (Signed)
Called patient again since she didn't return call to see if she was interested in taking Dupixent.  She advised at this time her skin is improving and wants to use whats to continue present treatment and not Dupixent

## 2020-07-29 NOTE — Telephone Encounter (Signed)
Ok Thanks [employer]

## 2020-08-22 ENCOUNTER — Telehealth: Payer: Self-pay | Admitting: Family Medicine

## 2020-08-22 NOTE — Telephone Encounter (Signed)
Pt asking for a call  to discuss pain and numbness now in her left leg as a result of the pseudotumor

## 2020-08-25 ENCOUNTER — Ambulatory Visit: Payer: 59

## 2020-08-25 NOTE — Telephone Encounter (Signed)
I called pt and she has multiple c/o. We have seen for IIH and migraines. Numbness tingling L leg, now R leg L arm,  Aching pain., memory worsening, on medication for depression,  Rhip brusitis.  Been to pcp low B12 on 1000u daily now.  Feels like needs testing done to figure it all out.  (she mentions MRI, I mentioned LP) as per other previous MD notes.  Please advise.  I do not have appt soon with you.  She asks about MRI by end of year.

## 2020-08-25 NOTE — Telephone Encounter (Signed)
I called pt and relayed AL/NP message.  Sx experiencing not related to migraines IIH,  MRI 's 2015,2019,2020.  recommended seeing pcp for eval and if needs neuro referral will need to see Dr. Vickey Huger for new sx.  Pt verbalized understanding.

## 2020-08-25 NOTE — Telephone Encounter (Signed)
I have reviewed MRI from 2015, 2019 and 2020. I do not have any indication to repeat based on migraines and IIH. If these symptoms are new, she needs to discuss with PCP. If they feel we need to see her for new symptoms, they can send referral and we can get her back in with Dr Vickey Huger.

## 2020-08-27 ENCOUNTER — Ambulatory Visit: Payer: 59 | Admitting: Allergy

## 2021-02-11 ENCOUNTER — Ambulatory Visit: Payer: 59

## 2021-03-05 ENCOUNTER — Ambulatory Visit: Payer: 59

## 2021-03-10 ENCOUNTER — Inpatient Hospital Stay: Admission: RE | Admit: 2021-03-10 | Payer: 59 | Source: Ambulatory Visit

## 2021-04-10 ENCOUNTER — Ambulatory Visit: Payer: 59

## 2021-04-30 ENCOUNTER — Ambulatory Visit: Payer: 59

## 2021-05-21 ENCOUNTER — Ambulatory Visit
Admission: RE | Admit: 2021-05-21 | Discharge: 2021-05-21 | Disposition: A | Discharge status: Home / Self Care | Payer: 59 | Source: Ambulatory Visit | Attending: Family Medicine | Admitting: Family Medicine

## 2021-05-21 ENCOUNTER — Other Ambulatory Visit: Payer: Self-pay

## 2021-05-21 DIAGNOSIS — Z1231 Encounter for screening mammogram for malignant neoplasm of breast: Secondary | ICD-10-CM

## 2021-06-25 ENCOUNTER — Ambulatory Visit: Payer: 59 | Admitting: Family Medicine

## 2022-04-21 ENCOUNTER — Other Ambulatory Visit: Payer: Self-pay | Admitting: Family Medicine

## 2022-04-21 DIAGNOSIS — Z1231 Encounter for screening mammogram for malignant neoplasm of breast: Secondary | ICD-10-CM

## 2022-05-31 ENCOUNTER — Ambulatory Visit
Admission: RE | Admit: 2022-05-31 | Discharge: 2022-05-31 | Disposition: A | Discharge status: Home / Self Care | Payer: 59 | Source: Ambulatory Visit | Attending: Family Medicine | Admitting: Family Medicine

## 2022-05-31 DIAGNOSIS — Z1231 Encounter for screening mammogram for malignant neoplasm of breast: Secondary | ICD-10-CM

## 2022-07-14 ENCOUNTER — Ambulatory Visit (INDEPENDENT_AMBULATORY_CARE_PROVIDER_SITE_OTHER): Payer: 59 | Admitting: Family Medicine

## 2022-07-14 ENCOUNTER — Encounter: Payer: Self-pay | Admitting: Family Medicine

## 2022-07-14 VITALS — BP 122/68 | HR 88 | Ht 65.0 in | Wt 236.0 lb

## 2022-07-14 DIAGNOSIS — G932 Benign intracranial hypertension: Secondary | ICD-10-CM

## 2022-07-14 DIAGNOSIS — G43109 Migraine with aura, not intractable, without status migrainosus: Secondary | ICD-10-CM

## 2022-07-14 NOTE — Progress Notes (Signed)
Faxed completed/signed form back to [place]. Received fax confirmation.

## 2022-07-14 NOTE — Patient Instructions (Signed)
Below is our plan:  We will continue to monitor. Barring any unforseen circumstances, I do not seen any concerns with having right hip replacement.   Please make sure you are staying well hydrated. I recommend 50-60 ounces daily. Well balanced diet and regular exercise encouraged. Consistent sleep schedule with 6-8 hours recommended.   Please continue follow up with care team as directed.   Follow up with me as needed   You may receive a survey regarding today's visit. I encourage you to leave honest feed back as I do use this information to improve patient care. Thank you for seeing me today!

## 2022-07-14 NOTE — Progress Notes (Signed)
PATIENT: Sherri Alvarado DOB: 1966-05-17  REASON FOR VISIT: follow up HISTORY FROM: patient  Chief Complaint  Patient presents with   Follow-up    Pt alone, rm 6, Pt has to have clearance for upcoming hip surgery. Overall migraines are stable. No issues or concerns. She is trying to have the upcoming surgery and they are just asking for neuro clearance. She hadn't been seen since 2021 so coming in for eval  for clearance.      HISTORY OF PRESENT ILLNESS:  07/14/22 Sherri [name] returns for follow up for headaches. She was last seen 06/2020 and doing well on gabapentin 300mg  BID. She had lost 14lbs and reported headaches were well managed. She has an upcoming hip surgery with Dr , ortho, who is requesting surgical clearance from a neurological standpoint. She states that headaches have nearly resolved. She is no longer taking prevention meds. She is taking alprazolam 1mg  BID for anxiety and insomnia. She reports weight has fluctuated. She has gained about 18lbs since last visit. She feels that she has not been as active due to pain.   She is followed closely by ophthalmology. She had bilateral cataract surgery in 12/2021 and 01/2022. She treated for glaucoma. No reported optic nerve edema. No vision changes. She reports vision is 20/20.   06/19/2020 Sherri [name] is a 56 y.o. female here today for follow up for headaches. She resumed citalopram and we increased gabapentin to 300mg  BID in 02/2020. PCP increased citalopram to 40mg .  Headaches have improved. She reports having 2-3 headaches every week with 1 or so being migrainous. She is taking OTC analgesics for abortive therapy. She is scared to add any other medications. She has failed sumatriptan. She is having more difficulty with hip pain. She was seen by ortho and diagnosed with right sided bursitis. She was started on Nsaids and tramadol. She started breaking out with a rash and has had to stop these meds. She is doing PT  which helps. She is having numbness of left leg. B12 was low.  She has lost 14 pounds. Her son is back home.   HISTORY: (copied from my note on 03/04/2020)  Sherri Alvarado is a 56 y.o. female here today for follow up for headaches.  Topiramate was discontinued at her last visit due to concerns of brain fog.  Citalopram 20 mg daily was added as there were concerns of anxiety and depression as well.  She reports that she was doing very well for about 2 months.  She discontinued citalopram over a month ago as she was feeling better.  She has noted worsening of depression and right leg pain over the past few weeks.  She restarted citalopram about a week ago.  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 She is having an aching pain in the right hip and thigh. She does feel that depression is better on citalopram. She had lost about 20 pounds but started "comfort eating" again after stopping it.    HISTORY: (copied from Dr Dohmeier's note on 11/28/2019)   This is a revisit for this 56 year- old afro-american female patient, on 11/28/19:,   Sherri Alvarado contracted Covid in November 2020 had flulike symptoms for about 8 days but recovered quickly without hospitalization.  She reports that her brother however passed away from complications of Covid in December last year year.  She has since struggled with multitasking cognitive abilities and she appears depressed.  As she has mentioned  her cognitive problems to my nurse a [place] cognitive assessment was performed today and she scored 27 out of 30 points.  Sherri Alvarado sleep points she lost word short-term memory.  She was able to multitask year and do visual-spatial path of the test as well as naming objects.  She is fully oriented to place and time and could do subtraction spelling and attention.   She is a 56 y.o. 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 She has smoked marihuana , quit in November with her COVID illness. She started taking xanax for Anxiety , worsened d since December. 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   She is afraid of losing the job. Has insomnia, getting not enough rest. Xanax through PCP (!).    She is here followed for pseudotumor cerebri, and recently was diagnosed with glaucoma, high intraocular pressure , and cataract, had a detached retina in the left eye- needs Ophthalmologist -subspecialist.  headaches are more frequent again, no numbness but a lot of stress.  She is tearful.    10-2018 I have the pleasure of meeting Sherri Alvarado she is a meanwhile 56 year old African-American patient had presented with chronic paroxysmal hemicrania and a history of pseudotumor cerebri.  I had ordered an MRI and spinal tap for the patient performed in February 2019, at the time she did not want fluid to be taken and rather pursue weight loss.  Unfortunately her weight loss plans have not fulfilled and we are now meeting with the patient that still has occasional headaches, some some pain in her right leg and some numbness sensation in the left arm stiffness and numbness around the neck are also described.  Given that her neuroimaging report from February 2019 showed only slight abnormalities nonspecific bilateral, subcortical, tiny white matter hyperintensities there is a vasculitis differential diagnosis to be resolved also we should look at demyelination.  The patient has no history history of an autoimmune disease, DM or CAD.      HISTORY OF PRESENT ILLNESS: 10-2016 Sherri Alvarado reports that she has had a stressful 2018, and tested positive for several food related allergies including nuts, beef, gluten-wheat. She also broke out in hives which she initially  thought was related to exercise.  Her extends her sides of the legs and arms have been covered in a rash.  The rash is no longer itching at this time but worse. EIA-since last induced allergies?  And knees have been found to show signs of osteoarthritis is not surprising: see age and weight. She stopped exercising at the gym between August and the beginning of the new year, and regained weight.  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  This brought on her migraines. weight has been her main risk factor for pseudotumor cerebri and her headaches have promptly gotten worse. Her neck pain responded to PT, and has now gotten worse again.   She asked for an elevated desk to stand at. She needs to address this also with her orthopedist and PCP.  I will concentrate on her Benign intracranial hypertension.  Has had last spinal tap was in 2017. She brought her ophthalmologists records. He did not find papilledema or optic disc palor  but reported elevated inner eye pressure/ glaucoma to be watched. Right eye vitreous detachment.   HISTORY 11/10/16: Mrs. lavaun wolfenden is a 56 year old female with a history of pseudotumor cerebri and meralgia paresthetica. She returns today for follow-up. She states in the last 2-3 months her headache frequency has increased. She states that she has  headache 2-3 times a week. She typically wakes up with the headache. She did switch Topamax to the AM- 25 mg. She states occasionally she will take a second dose but overall she is only been doing 25 mg daily. The patient had a follow-up last week with her ophthalmologist and she was put on eyedrops for glaucoma she reports that her ophthalmologist said that she had no swelling related to pseudotumor cerebri. The patient has also been losing weight. She states that she has lost approximately 20 pounds in the last month and a half. She states that  she is trying to lose weight. She is also exercising. She's had a sleep study in the past that was unremarkable. She states that in the last several weeks she's also been having numbness again on the right thigh. She denies any pain. She returns today for an evaluation.    REVIEW OF SYSTEMS: Out of a complete 14 system review of symptoms, the patient complains only of the following symptoms, headaches, hip pain, numbness left leg, anxiety, depression and Sherri Alvarado other reviewed systems are negative.  ALLERGIES: Allergies  Allergen Reactions   Banana Other (See Comments)    TINGLING OF TONGUE   Flour Hives    Also, wheat bran    Other Hives and Itching    TREE NUTS AND BEEF   Codeine Itching    HOME MEDICATIONS: Outpatient Medications Prior to Visit  Medication Sig Dispense Refill   ALPRAZolam (XANAX) 1 MG tablet Take 1 mg by mouth 2 (two) times daily as needed.     latanoprost (XALATAN) 0.005 % ophthalmic solution      triamcinolone ointment (KENALOG) 0.1 % Apply 1 application topically 2 (two) times daily. Apply to affected areas. Be sure to avoid the face, neck, armpits and groin areas. 600 g 3   citalopram (CELEXA) 40 MG tablet Take 40 mg by mouth daily.     gabapentin (NEURONTIN) 300 MG capsule Take 1 capsule (300 mg total) by mouth 2 (two) times daily. 180 capsule 3   mirtazapine (REMERON) 15 MG tablet Take 15 mg by mouth at bedtime.     Multiple Vitamin (MULTIVITAMIN) tablet Take 1 tablet by mouth daily.     mupirocin cream (BACTROBAN) 2 % Apply to open skin ares to help them heal 30 g 2   triamcinolone cream (KENALOG) 0.1 %      vitamin B-12 (CYANOCOBALAMIN) 50 MCG tablet Take 50 mcg by mouth daily.     No facility-administered medications prior to visit.    PAST MEDICAL HISTORY: Past Medical History:  Diagnosis Date   Breast hypertrophy 05/2014   Dental crowns present    Eczema    Hx of bilateral breast reduction surgery 07/31/2014   Idiopathic intracranial hypertension     Migraine equivalent syndrome 10/25/2017   Pseudotumor cerebri    S/P tooth extraction 06/04/2014    PAST SURGICAL HISTORY: Past Surgical History:  Procedure Laterality Date   BREAST REDUCTION SURGERY Bilateral 06/11/2014   Procedure: BILATERAL BREAST REDUCTION ;  Surgeon: Karie Fetch, MD;  Location: [facility];  Service: Plastics;  Laterality: Bilateral;   CESAREAN SECTION  x 2   REDUCTION MAMMAPLASTY Bilateral     FAMILY HISTORY: Family History  Problem Relation Age of Onset   Multiple sclerosis Mother    Heart murmur Mother    Hypertension Mother    Asthma Mother    Food Allergy Mother        pepper   Eczema Mother    Kidney disease Father    Cancer - Other Father        throat   Eczema Father    Asthma Maternal Aunt    Asthma Maternal Grandmother    Allergic rhinitis Neg Hx    Angioedema Neg Hx    Atopy Neg Hx    Immunodeficiency Neg Hx    Urticaria Neg Hx     SOCIAL HISTORY: Social History   Socioeconomic History   Marital status: Divorced    Spouse name: Not on file   Number of children: 2   Years of education: 15   Highest education level: Not on file  Occupational History    Employer: [employer]  Tobacco Use   Smoking status: Never   Smokeless tobacco: Never  Substance and Sexual Activity   Alcohol use: Yes    Alcohol/week: 0.0 standard drinks of alcohol    Comment: occasionally   Drug use: No    Types: Marijuana    Comment: last used 04/2014   Sexual activity: Not on file  Other Topics Concern   Not on file  Social History Narrative   Separated, 2 children   Right handed   3 yrs of college   Patient drinks very little caffeine.   Social Determinants of Health   Financial Resource Strain: Not on file  Food Insecurity: Not on file  Transportation Needs: Not on file  Physical Activity: Not on file  Stress: Not on file  Social Connections: Not on file  Intimate Partner Violence: Not on file      PHYSICAL  EXAM  Vitals:   07/14/22 1359  BP: 122/68  Pulse: 88  Weight: 236 lb (107 kg)  Height: 5\' 5"  (1.651 m)    Body mass index is 39.27 kg/m.  Generalized: Well developed, in no acute distress  Cardiology: normal rate and rhythm, no murmur noted Respiratory: clear to auscultation bilaterally  Neurological examination  Mentation: Alert oriented to time, place, history taking. Follows Sherri Alvarado commands speech and language fluent Cranial nerve II-XII: Pupils were equal round reactive to light. Extraocular movements were full, visual field were full  Motor: The motor testing reveals 5 over 5 strength of Sherri Alvarado 4 extremities. Good symmetric motor tone is noted throughout.  Gait and station: Gait is normal.    DIAGNOSTIC DATA (LABS, IMAGING, TESTING) - I reviewed patient records, labs, notes, testing and imaging myself where available.      No data to display           Lab Results  Component Value Date   HGB 11.9 (L) 06/11/2014      Component Value Date/Time   NA 148 (H) 10/02/2018 1650   K 4.4 10/02/2018 1650   CL 110 (H) 10/02/2018 1650   CO2 22 10/02/2018 1650   GLUCOSE 84 10/02/2018 1650   BUN 9 10/02/2018 1650   CREATININE 0.98 10/02/2018 1650   CALCIUM 9.5 10/02/2018 1650   PROT 7.2 10/02/2018 1650   ALBUMIN 4.5 10/02/2018 1650   AST 18 10/02/2018 1650   ALT 12 10/02/2018 1650   ALKPHOS 77 10/02/2018 1650   BILITOT 0.3  10/02/2018 1650   GFRNONAA 66 10/02/2018 1650   GFRAA 76 10/02/2018 1650   No results found for: "CHOL", "HDL", "LDLCALC", "LDLDIRECT", "TRIG", "CHOLHDL" No results found for: "HGBA1C" No results found for: "VITAMINB12" No results found for: "TSH"     ASSESSMENT AND PLAN 56 y.o. 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 here with     ICD-10-CM   1. Migraine equivalent syndrome  G43.109     2. Pseudotumor cerebri  G93.2        Berneda reports headaches have nearly resolved. No vision changes or concerns with ophthalmology exams. From a neurology standpoint and barring any unforseen circumstances, she should do well with upcoming hip replacement surgery. She was encouraged to continue close follow-up with primary care and orthopedics for management of pain.  Healthy lifestyle habits encouraged.  She will follow up with me as needed.  She verbalizes understanding and agreement with this plan.   No orders of the defined types were placed in this encounter.    No orders of the defined types were placed in this encounter.     Shawnie Dapper, FNP-C 07/14/2022, 2:26 PM [facility] [address] [place], [place] [address] [phone]

## 2023-07-01 ENCOUNTER — Other Ambulatory Visit: Payer: Self-pay | Admitting: Family Medicine

## 2023-07-01 DIAGNOSIS — Z1231 Encounter for screening mammogram for malignant neoplasm of breast: Secondary | ICD-10-CM

## 2023-08-05 ENCOUNTER — Ambulatory Visit: Payer: 59

## 2023-09-02 ENCOUNTER — Ambulatory Visit: Payer: 59

## 2023-09-23 ENCOUNTER — Ambulatory Visit: Payer: 59

## 2023-10-06 ENCOUNTER — Ambulatory Visit: Payer: 59

## 2023-10-19 ENCOUNTER — Ambulatory Visit
Admission: RE | Admit: 2023-10-19 | Discharge: 2023-10-19 | Disposition: A | Discharge status: Home / Self Care | Payer: 59 | Source: Ambulatory Visit | Attending: Family Medicine | Admitting: Family Medicine

## 2023-10-19 DIAGNOSIS — Z1231 Encounter for screening mammogram for malignant neoplasm of breast: Secondary | ICD-10-CM
